# Patient Record
Sex: Female | Born: 1980 | Race: White | Hispanic: No | Marital: Married | Smoking: Never smoker
Health system: Southern US, Community
[De-identification: ages and names within clinical notes are randomized; demographics above are authoritative.]

## PROBLEM LIST (undated history)

## (undated) DIAGNOSIS — F32A Depression, unspecified: Secondary | ICD-10-CM

## (undated) DIAGNOSIS — F419 Anxiety disorder, unspecified: Secondary | ICD-10-CM

## (undated) DIAGNOSIS — L709 Acne, unspecified: Secondary | ICD-10-CM

## (undated) DIAGNOSIS — F329 Major depressive disorder, single episode, unspecified: Secondary | ICD-10-CM

## (undated) HISTORY — PX: WISDOM TOOTH EXTRACTION: SHX21

## (undated) HISTORY — PX: DILATION AND CURETTAGE OF UTERUS: SHX78

---

## 2013-02-12 ENCOUNTER — Other Ambulatory Visit (HOSPITAL_COMMUNITY): Payer: Self-pay | Admitting: Gynecology

## 2013-02-12 DIAGNOSIS — Z3141 Encounter for fertility testing: Secondary | ICD-10-CM

## 2013-02-20 ENCOUNTER — Ambulatory Visit (HOSPITAL_COMMUNITY)
Admission: RE | Admit: 2013-02-20 | Discharge: 2013-02-20 | Disposition: A | Discharge status: Home / Self Care | Payer: BC Managed Care – PPO | Source: Ambulatory Visit | Attending: Gynecology | Admitting: Gynecology

## 2013-02-20 DIAGNOSIS — Z3141 Encounter for fertility testing: Secondary | ICD-10-CM

## 2013-02-20 DIAGNOSIS — N979 Female infertility, unspecified: Secondary | ICD-10-CM | POA: Insufficient documentation

## 2013-02-20 MED ORDER — IOHEXOL 300 MG/ML  SOLN
10.0000 mL | Freq: Once | INTRAMUSCULAR | Status: AC | PRN
  Administered 2013-02-20: 10 mL

## 2013-08-12 ENCOUNTER — Other Ambulatory Visit (HOSPITAL_COMMUNITY): Payer: Self-pay | Admitting: Obstetrics & Gynecology

## 2013-08-12 DIAGNOSIS — Z0489 Encounter for examination and observation for other specified reasons: Secondary | ICD-10-CM

## 2013-08-12 DIAGNOSIS — IMO0002 Reserved for concepts with insufficient information to code with codable children: Secondary | ICD-10-CM

## 2013-08-19 ENCOUNTER — Ambulatory Visit (HOSPITAL_COMMUNITY): Payer: BC Managed Care – PPO

## 2013-08-20 DIAGNOSIS — Z3400 Encounter for supervision of normal first pregnancy, unspecified trimester: Secondary | ICD-10-CM | POA: Insufficient documentation

## 2013-08-20 DIAGNOSIS — Z3A25 25 weeks gestation of pregnancy: Secondary | ICD-10-CM

## 2013-08-27 ENCOUNTER — Ambulatory Visit (HOSPITAL_COMMUNITY): Payer: BC Managed Care – PPO

## 2013-11-15 ENCOUNTER — Inpatient Hospital Stay (HOSPITAL_COMMUNITY)
Admission: AD | Admit: 2013-11-15 | Payer: BC Managed Care – PPO | Source: Ambulatory Visit | Admitting: Obstetrics and Gynecology

## 2013-12-18 DIAGNOSIS — G4700 Insomnia, unspecified: Secondary | ICD-10-CM | POA: Insufficient documentation

## 2013-12-18 DIAGNOSIS — F32A Depression, unspecified: Secondary | ICD-10-CM | POA: Insufficient documentation

## 2013-12-18 DIAGNOSIS — Z0000 Encounter for general adult medical examination without abnormal findings: Secondary | ICD-10-CM | POA: Insufficient documentation

## 2014-10-16 DIAGNOSIS — Z021 Encounter for pre-employment examination: Secondary | ICD-10-CM | POA: Insufficient documentation

## 2014-12-10 IMAGING — RF DG HYSTEROGRAM
4 series · 4 of 4 positions shown · non-contrast
Comparison: none

CLINICAL DATA: Infertility

EXAM:
HYSTEROSALPINGOGRAM
TECHNIQUE: Hysterosalpingogram was performed by the ordering physician under
fluoroscopy. Fluoroscopic images were submitted for radiologic
interpretation following the procedure. Please see the procedural
report for the amount of contrast and the fluoroscopy time utilized.

[Series 1: run · 1 of 1 slices shown (1 of 4)]
[im 1/1]
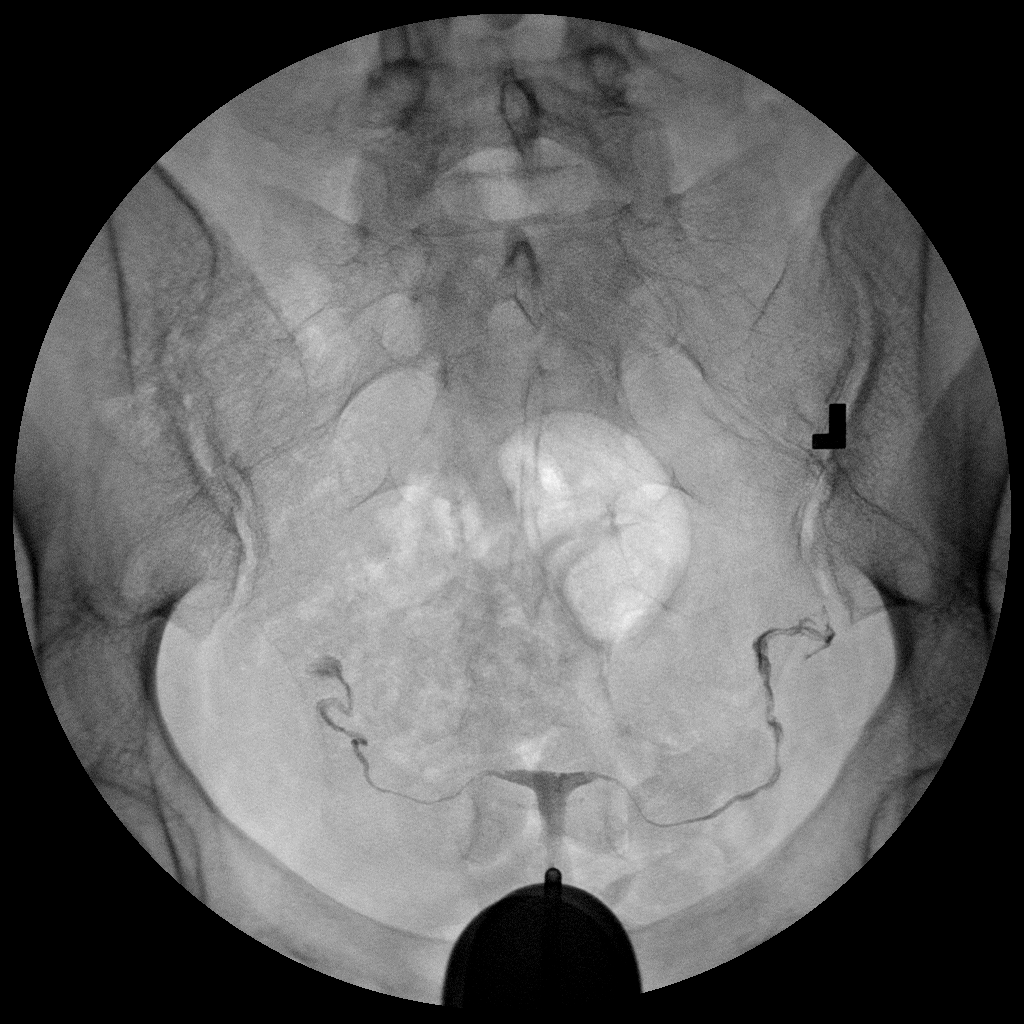

[Series 2: run · 1 of 1 slices shown (2 of 4)]
[im 1/1]
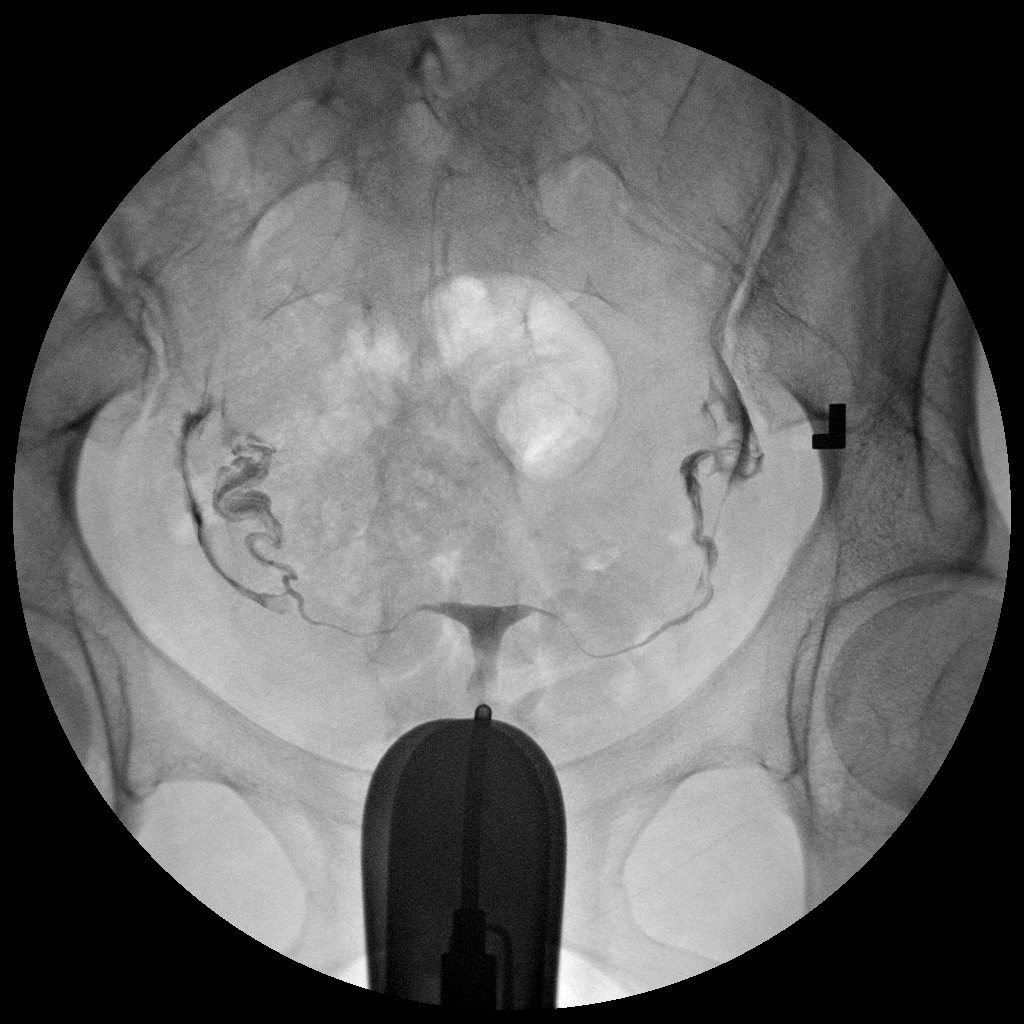

[Series 3: run · 1 of 1 slices shown (3 of 4)]
[im 1/1]
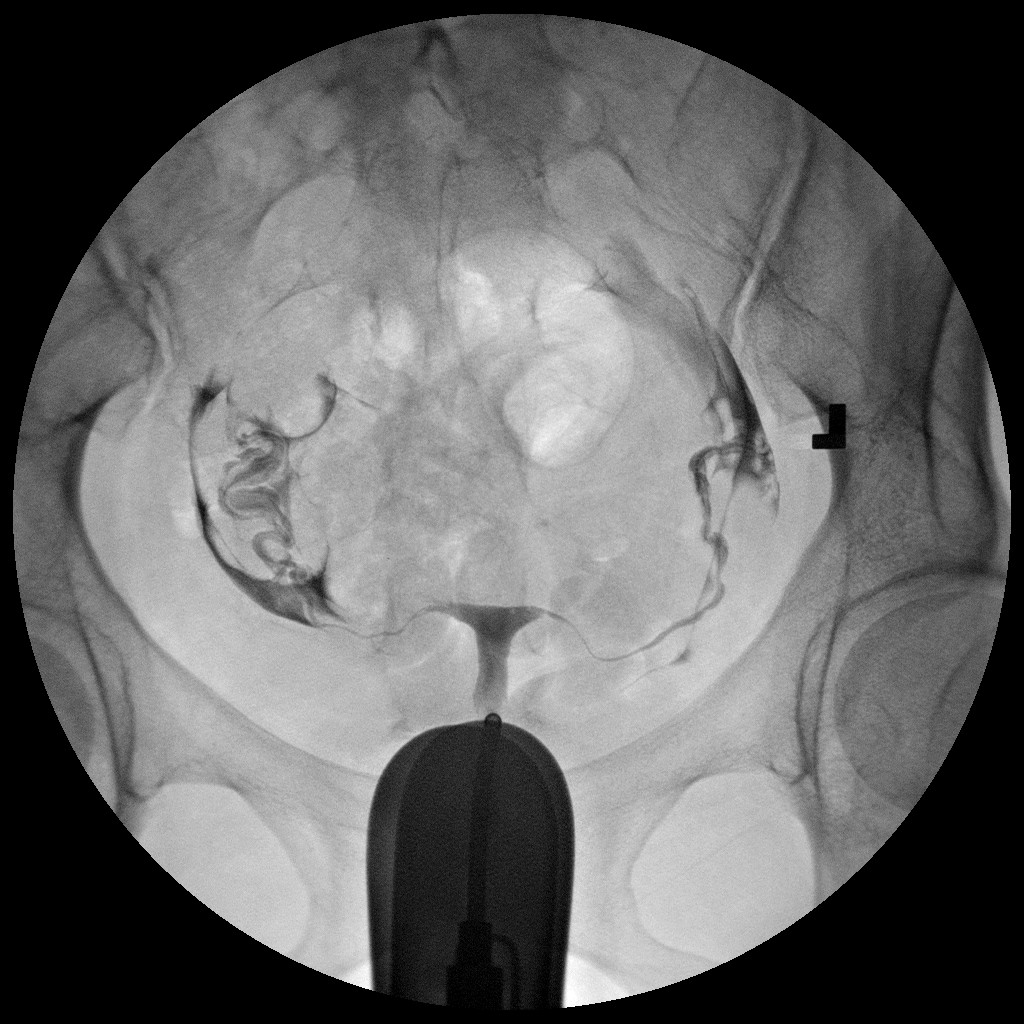

[Series 4: run · 1 of 1 slices shown (4 of 4)]
[im 1/1]
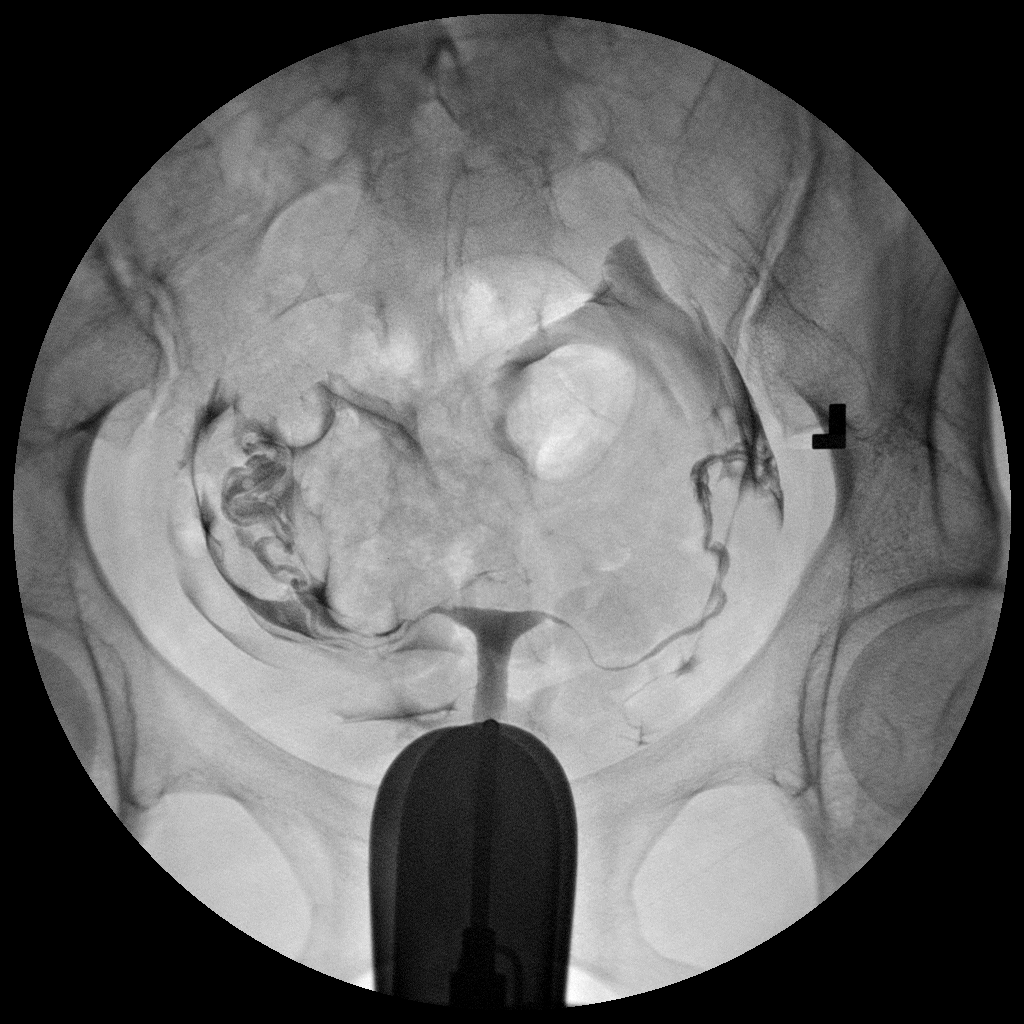

[4 of 4 positions shown; findings below may reference images not displayed]

FINDINGS: There is free spillage of contrast material from both fallopian
tubes. Normal appearance of the uterus. Several small filling
defects are noted within the uterine cavity which likely represent
air bubbles.
IMPRESSION: 1. Bilateral fallopian tube patency.

## 2014-12-22 ENCOUNTER — Ambulatory Visit (HOSPITAL_COMMUNITY): Payer: BC Managed Care – PPO

## 2014-12-25 ENCOUNTER — Ambulatory Visit (HOSPITAL_COMMUNITY)
Admission: RE | Admit: 2014-12-25 | Discharge: 2014-12-25 | Disposition: A | Discharge status: Home / Self Care | Payer: BC Managed Care – PPO | Source: Ambulatory Visit | Attending: Obstetrics & Gynecology | Admitting: Obstetrics & Gynecology

## 2014-12-25 DIAGNOSIS — Z3169 Encounter for other general counseling and advice on procreation: Secondary | ICD-10-CM | POA: Insufficient documentation

## 2014-12-25 NOTE — ED Notes (Signed)
Patient here for preconception consult.  VS, BP 126/86, 112, Wt. 167.8 lbs

## 2014-12-25 NOTE — Progress Notes (Signed)
MATERNAL FETAL MEDICINE CONSULT  Patient Name: Caitlyn [name] Medical Record Number:  [id number] Date of Birth: 08/10/1980 Requesting Physician Name:  Mitchel HonourMegan Morris, DO Date of Service: 12/25/2014  Chief Complaint Prior preterm delivery via classical cesarean section at 25 weeks  History of Present Illness Caitlyn [name] is a 34 y.o. G1P010 who was seen today for a preconceptual consult secondary to a prior preterm delivery via classical cesarean section at 25 weeks at the request of Mitchel HonourMegan Morris, DO.  She experienced progressively worsening back and pelvic pain in her last pregnancy and was found to be 4 cm upon arrival to the hospital.  Soon afterward underwent a classical cesarean section due to non-reassuring fetal status.  She returns today to discuss what can be done to decrease the risk of recurrent preterm birth in her subsequent pregnancies.  She has no complaints at this time.  Review of Systems Pertinent items are noted in HPI.  Obstetrical History G1 preterm labor with classical cesarean delivery at 25 weeks  Past Medical and Surgical History The patient has no history of chronic medical diseases or prior surgeries.  Family History The patient and the father of the baby have no family history of mental retardation, birth defects, or genetic diseases.  Social History The patient does not use tobacco, alcohol, or other illicit drugs.  Assessment and Recommendations 1.  Prior preterm birth.  I discussed the currently available therapies for the prevention of recurrent preterm birth in detail with Caitlyn [name] and her husband.  She should receive weekly 17-OH progesterone injections beginning at 16 weeks and continuing through 36 weeks.  In addition, she should have serial transvaginal cervical length measurements also starting at 16 weeks.  These can initially be done every 2 weeks, but should be done weekly if evidence of cervical shortening is found.  If her cervical length should  fall below 2.5 cm prior to 23 weeks of gestation cervical cerclage placement should be considered.  Should cervical shortening be found after viability, hospitalization could be considered especially if very remote from term and the patient lives far from a tertiary care center to ensure delivery of a severely preterm infant occurs at an appropriately equipped facility.  However, strict bedrest has not been proven to reduce risk of preterm delivery or prolong gestations and thus should be avoided.  However, activity restriction would be reasonable again especially if cervical shortening is seen very remote from term. 2.  Prior classical cesarean section.  Due to her prior classical uterine incision she will require delivery via repeat cesarean section no later than 36 week in all future pregnancies.  A future trial of labor is contraindicated as it would carry an unacceptably high risk of uterine rupture.  I spent 30 minutes with Caitlyn [name] today of which 50% was face-to-face counseling.  [name].  Please do not hesitate to contact us with questions.   Rema FendtNITSCHE,Areen Trautner, MD

## 2014-12-28 ENCOUNTER — Other Ambulatory Visit (HOSPITAL_COMMUNITY): Payer: Self-pay | Admitting: Obstetrics & Gynecology

## 2015-01-11 DIAGNOSIS — M546 Pain in thoracic spine: Secondary | ICD-10-CM | POA: Insufficient documentation

## 2016-02-28 DIAGNOSIS — Z23 Encounter for immunization: Secondary | ICD-10-CM | POA: Insufficient documentation

## 2016-06-05 DIAGNOSIS — R5383 Other fatigue: Secondary | ICD-10-CM | POA: Insufficient documentation

## 2016-06-05 DIAGNOSIS — R0683 Snoring: Secondary | ICD-10-CM | POA: Insufficient documentation

## 2017-02-09 ENCOUNTER — Inpatient Hospital Stay (HOSPITAL_COMMUNITY)
Admission: AD | Admit: 2017-02-09 | Discharge: 2017-02-11 | DRG: 786 | Disposition: A | Discharge status: Home / Self Care | Payer: BC Managed Care – PPO | Source: Ambulatory Visit | Attending: Obstetrics and Gynecology | Admitting: Obstetrics and Gynecology

## 2017-02-09 ENCOUNTER — Encounter (HOSPITAL_COMMUNITY): Payer: Self-pay | Admitting: *Deleted

## 2017-02-09 ENCOUNTER — Inpatient Hospital Stay (HOSPITAL_COMMUNITY): Payer: BC Managed Care – PPO

## 2017-02-09 ENCOUNTER — Inpatient Hospital Stay (HOSPITAL_COMMUNITY): Payer: BC Managed Care – PPO | Admitting: Anesthesiology

## 2017-02-09 ENCOUNTER — Other Ambulatory Visit: Payer: Self-pay | Admitting: Obstetrics and Gynecology

## 2017-02-09 ENCOUNTER — Encounter (HOSPITAL_COMMUNITY)
Admission: AD | Disposition: A | Discharge status: Home / Self Care | Payer: Self-pay | Source: Ambulatory Visit | Attending: Obstetrics and Gynecology

## 2017-02-09 DIAGNOSIS — O365930 Maternal care for other known or suspected poor fetal growth, third trimester, not applicable or unspecified: Secondary | ICD-10-CM | POA: Diagnosis present

## 2017-02-09 DIAGNOSIS — Z98891 History of uterine scar from previous surgery: Secondary | ICD-10-CM

## 2017-02-09 DIAGNOSIS — E669 Obesity, unspecified: Secondary | ICD-10-CM | POA: Diagnosis present

## 2017-02-09 DIAGNOSIS — O4593 Premature separation of placenta, unspecified, third trimester: Secondary | ICD-10-CM | POA: Diagnosis present

## 2017-02-09 DIAGNOSIS — O34212 Maternal care for vertical scar from previous cesarean delivery: Secondary | ICD-10-CM | POA: Diagnosis present

## 2017-02-09 DIAGNOSIS — O99214 Obesity complicating childbirth: Secondary | ICD-10-CM | POA: Diagnosis present

## 2017-02-09 DIAGNOSIS — O368130 Decreased fetal movements, third trimester, not applicable or unspecified: Principal | ICD-10-CM | POA: Diagnosis present

## 2017-02-09 DIAGNOSIS — O368330 Maternal care for abnormalities of the fetal heart rate or rhythm, third trimester, not applicable or unspecified: Secondary | ICD-10-CM | POA: Diagnosis not present

## 2017-02-09 DIAGNOSIS — Z3A32 32 weeks gestation of pregnancy: Secondary | ICD-10-CM | POA: Diagnosis not present

## 2017-02-09 DIAGNOSIS — O321XX0 Maternal care for breech presentation, not applicable or unspecified: Secondary | ICD-10-CM | POA: Diagnosis present

## 2017-02-09 DIAGNOSIS — O368390 Maternal care for abnormalities of the fetal heart rate or rhythm, unspecified trimester, not applicable or unspecified: Secondary | ICD-10-CM

## 2017-02-09 DIAGNOSIS — O368120 Decreased fetal movements, second trimester, not applicable or unspecified: Secondary | ICD-10-CM

## 2017-02-09 HISTORY — DX: Depression, unspecified: F32.A

## 2017-02-09 HISTORY — DX: Anxiety disorder, unspecified: F41.9

## 2017-02-09 HISTORY — DX: Major depressive disorder, single episode, unspecified: F32.9

## 2017-02-09 LAB — CBC
HEMATOCRIT: 37.1 % (ref 36.0–46.0)
HEMOGLOBIN: 12.4 g/dL (ref 12.0–15.0)
MCH: 31.5 pg (ref 26.0–34.0)
MCHC: 33.4 g/dL (ref 30.0–36.0)
MCV: 94.2 fL (ref 78.0–100.0)
Platelets: 220 10*3/uL (ref 150–400)
RBC: 3.94 MIL/uL (ref 3.87–5.11)
RDW: 12.7 % (ref 11.5–15.5)
WBC: 12.2 10*3/uL — AB (ref 4.0–10.5)

## 2017-02-09 LAB — TYPE AND SCREEN
ABO/RH(D): B POS
ANTIBODY SCREEN: NEGATIVE

## 2017-02-09 SURGERY — Surgical Case
Anesthesia: Spinal

## 2017-02-09 MED ORDER — CEFAZOLIN SODIUM-DEXTROSE 2-3 GM-%(50ML) IV SOLR
INTRAVENOUS | Status: AC
  Filled 2017-02-09: qty 50

## 2017-02-09 MED ORDER — SOD CITRATE-CITRIC ACID 500-334 MG/5ML PO SOLN
30.0000 mL | Freq: Once | ORAL | Status: AC
  Administered 2017-02-09: 30 mL via ORAL

## 2017-02-09 MED ORDER — SIMETHICONE 80 MG PO CHEW
80.0000 mg | CHEWABLE_TABLET | ORAL | Status: DC
  Administered 2017-02-10 (×2): 80 mg via ORAL
  Filled 2017-02-09 (×2): qty 1

## 2017-02-09 MED ORDER — ONDANSETRON HCL 4 MG/2ML IJ SOLN
INTRAMUSCULAR | Status: AC
  Filled 2017-02-09: qty 2

## 2017-02-09 MED ORDER — TERBUTALINE SULFATE 1 MG/ML IJ SOLN: 0.2500 mg | Freq: Once | INTRAMUSCULAR | Status: DC

## 2017-02-09 MED ORDER — HYDROMORPHONE HCL 1 MG/ML IJ SOLN
INTRAMUSCULAR | Status: DC | PRN
  Administered 2017-02-09: 1 mg via INTRAVENOUS

## 2017-02-09 MED ORDER — SUCCINYLCHOLINE CHLORIDE 20 MG/ML IJ SOLN
INTRAMUSCULAR | Status: DC | PRN
  Administered 2017-02-09: 160 mg via INTRAVENOUS

## 2017-02-09 MED ORDER — SODIUM CHLORIDE 0.9 % IR SOLN
Status: DC | PRN
  Administered 2017-02-09: 600 mL

## 2017-02-09 MED ORDER — MIDAZOLAM HCL 2 MG/2ML IJ SOLN
INTRAMUSCULAR | Status: DC | PRN
  Administered 2017-02-09: 2 mg via INTRAVENOUS

## 2017-02-09 MED ORDER — WITCH HAZEL-GLYCERIN EX PADS: 1.0000 "application " | MEDICATED_PAD | CUTANEOUS | Status: DC | PRN

## 2017-02-09 MED ORDER — BUPROPION HCL ER (XL) 150 MG PO TB24
150.0000 mg | ORAL_TABLET | Freq: Every day | ORAL | Status: DC
  Filled 2017-02-09: qty 1

## 2017-02-09 MED ORDER — SIMETHICONE 80 MG PO CHEW: 80.0000 mg | CHEWABLE_TABLET | ORAL | Status: DC | PRN

## 2017-02-09 MED ORDER — BETAMETHASONE SOD PHOS & ACET 6 (3-3) MG/ML IJ SUSP: 12.0000 mg | Freq: Once | INTRAMUSCULAR | Status: DC

## 2017-02-09 MED ORDER — HYDROMORPHONE HCL 1 MG/ML IJ SOLN
INTRAMUSCULAR | Status: AC
  Filled 2017-02-09: qty 1

## 2017-02-09 MED ORDER — LACTATED RINGERS IV SOLN
INTRAVENOUS | Status: DC
  Administered 2017-02-09: 20:00:00 via INTRAVENOUS

## 2017-02-09 MED ORDER — SCOPOLAMINE 1 MG/3DAYS TD PT72
MEDICATED_PATCH | TRANSDERMAL | Status: DC | PRN
  Administered 2017-02-09: 1 via TRANSDERMAL

## 2017-02-09 MED ORDER — BETAMETHASONE SOD PHOS & ACET 6 (3-3) MG/ML IJ SUSP
12.0000 mg | INTRAMUSCULAR | Status: DC
  Administered 2017-02-09: 12 mg via INTRAMUSCULAR
  Filled 2017-02-09: qty 2

## 2017-02-09 MED ORDER — FAMOTIDINE IN NACL 20-0.9 MG/50ML-% IV SOLN
INTRAVENOUS | Status: AC
  Filled 2017-02-09: qty 50

## 2017-02-09 MED ORDER — LACTATED RINGERS IV BOLUS (SEPSIS): 1000.0000 mL | Freq: Once | INTRAVENOUS | Status: DC

## 2017-02-09 MED ORDER — TERBUTALINE SULFATE 1 MG/ML IJ SOLN
INTRAMUSCULAR | Status: AC
  Filled 2017-02-09: qty 1

## 2017-02-09 MED ORDER — SENNOSIDES-DOCUSATE SODIUM 8.6-50 MG PO TABS
2.0000 | ORAL_TABLET | ORAL | Status: DC
  Administered 2017-02-10 (×2): 2 via ORAL
  Filled 2017-02-09 (×2): qty 2

## 2017-02-09 MED ORDER — ZOLPIDEM TARTRATE 5 MG PO TABS: 5.0000 mg | ORAL_TABLET | Freq: Every evening | ORAL | Status: DC | PRN

## 2017-02-09 MED ORDER — ACETAMINOPHEN 325 MG PO TABS: 650.0000 mg | ORAL_TABLET | ORAL | Status: DC | PRN

## 2017-02-09 MED ORDER — COCONUT OIL OIL
1.0000 "application " | TOPICAL_OIL | Status: DC | PRN
  Administered 2017-02-10: 1 via TOPICAL
  Filled 2017-02-09: qty 120

## 2017-02-09 MED ORDER — OXYCODONE-ACETAMINOPHEN 5-325 MG PO TABS: 2.0000 | ORAL_TABLET | ORAL | Status: DC | PRN

## 2017-02-09 MED ORDER — DIBUCAINE 1 % RE OINT: 1.0000 "application " | TOPICAL_OINTMENT | RECTAL | Status: DC | PRN

## 2017-02-09 MED ORDER — OXYTOCIN 40 UNITS IN LACTATED RINGERS INFUSION - SIMPLE MED: 2.5000 [IU]/h | INTRAVENOUS | Status: AC

## 2017-02-09 MED ORDER — FENTANYL CITRATE (PF) 250 MCG/5ML IJ SOLN
INTRAMUSCULAR | Status: AC
  Filled 2017-02-09: qty 5

## 2017-02-09 MED ORDER — TRAZODONE HCL 100 MG PO TABS
100.0000 mg | ORAL_TABLET | Freq: Every day | ORAL | Status: DC
  Administered 2017-02-10: 100 mg via ORAL
  Filled 2017-02-09 (×3): qty 1

## 2017-02-09 MED ORDER — SODIUM CHLORIDE 0.9 % IR SOLN
Status: DC | PRN
  Administered 2017-02-09: 1000 mL

## 2017-02-09 MED ORDER — BUSPIRONE HCL 10 MG PO TABS
10.0000 mg | ORAL_TABLET | Freq: Two times a day (BID) | ORAL | Status: DC
Start: 2017-02-09 — End: 2017-02-11
  Administered 2017-02-10 – 2017-02-11 (×4): 10 mg via ORAL
  Filled 2017-02-09 (×7): qty 1

## 2017-02-09 MED ORDER — TETANUS-DIPHTH-ACELL PERTUSSIS 5-2.5-18.5 LF-MCG/0.5 IM SUSP: 0.5000 mL | Freq: Once | INTRAMUSCULAR | Status: DC

## 2017-02-09 MED ORDER — LIDOCAINE HCL (CARDIAC) 20 MG/ML IV SOLN
INTRAVENOUS | Status: AC
  Filled 2017-02-09: qty 5

## 2017-02-09 MED ORDER — FENTANYL CITRATE (PF) 100 MCG/2ML IJ SOLN
25.0000 ug | INTRAMUSCULAR | Status: DC | PRN
  Administered 2017-02-09 (×2): 50 ug via INTRAVENOUS
  Administered 2017-02-09: 25 ug via INTRAVENOUS

## 2017-02-09 MED ORDER — SOD CITRATE-CITRIC ACID 500-334 MG/5ML PO SOLN
ORAL | Status: AC
  Filled 2017-02-09: qty 15

## 2017-02-09 MED ORDER — FENTANYL CITRATE (PF) 100 MCG/2ML IJ SOLN
INTRAMUSCULAR | Status: AC
  Filled 2017-02-09: qty 2

## 2017-02-09 MED ORDER — IBUPROFEN 600 MG PO TABS
600.0000 mg | ORAL_TABLET | Freq: Four times a day (QID) | ORAL | Status: DC
  Administered 2017-02-10 – 2017-02-11 (×6): 600 mg via ORAL
  Filled 2017-02-09 (×6): qty 1

## 2017-02-09 MED ORDER — FENTANYL CITRATE (PF) 100 MCG/2ML IJ SOLN
INTRAMUSCULAR | Status: AC
  Administered 2017-02-09: 25 ug via INTRAVENOUS
  Filled 2017-02-09: qty 2

## 2017-02-09 MED ORDER — OXYTOCIN 10 UNIT/ML IJ SOLN
INTRAVENOUS | Status: DC | PRN
  Administered 2017-02-09: 40 [IU] via INTRAVENOUS

## 2017-02-09 MED ORDER — FENTANYL CITRATE (PF) 100 MCG/2ML IJ SOLN
INTRAMUSCULAR | Status: AC
  Administered 2017-02-09: 50 ug via INTRAVENOUS
  Filled 2017-02-09: qty 2

## 2017-02-09 MED ORDER — PHENYLEPHRINE 8 MG IN D5W 100 ML (0.08MG/ML) PREMIX OPTIME
INJECTION | INTRAVENOUS | Status: AC
  Filled 2017-02-09: qty 100

## 2017-02-09 MED ORDER — MENTHOL 3 MG MT LOZG: 1.0000 | LOZENGE | OROMUCOSAL | Status: DC | PRN

## 2017-02-09 MED ORDER — DIPHENHYDRAMINE HCL 25 MG PO CAPS: 25.0000 mg | ORAL_CAPSULE | Freq: Four times a day (QID) | ORAL | Status: DC | PRN

## 2017-02-09 MED ORDER — SIMETHICONE 80 MG PO CHEW
80.0000 mg | CHEWABLE_TABLET | Freq: Three times a day (TID) | ORAL | Status: DC
  Administered 2017-02-10 – 2017-02-11 (×4): 80 mg via ORAL
  Filled 2017-02-09 (×4): qty 1

## 2017-02-09 MED ORDER — OXYTOCIN 10 UNIT/ML IJ SOLN
INTRAMUSCULAR | Status: AC
  Filled 2017-02-09: qty 4

## 2017-02-09 MED ORDER — FAMOTIDINE IN NACL 20-0.9 MG/50ML-% IV SOLN
20.0000 mg | Freq: Once | INTRAVENOUS | Status: AC
  Administered 2017-02-09: 20 mg via INTRAVENOUS

## 2017-02-09 MED ORDER — MIDAZOLAM HCL 2 MG/2ML IJ SOLN
INTRAMUSCULAR | Status: AC
  Filled 2017-02-09: qty 2

## 2017-02-09 MED ORDER — SUCCINYLCHOLINE CHLORIDE 200 MG/10ML IV SOSY
PREFILLED_SYRINGE | INTRAVENOUS | Status: AC
  Filled 2017-02-09: qty 20

## 2017-02-09 MED ORDER — OXYCODONE-ACETAMINOPHEN 5-325 MG PO TABS
1.0000 | ORAL_TABLET | ORAL | Status: DC | PRN
  Administered 2017-02-10: 1 via ORAL
  Filled 2017-02-09: qty 1

## 2017-02-09 MED ORDER — LIDOCAINE HCL (CARDIAC) 20 MG/ML IV SOLN
INTRAVENOUS | Status: DC | PRN
  Administered 2017-02-09: 100 mg via INTRAVENOUS

## 2017-02-09 MED ORDER — TERBUTALINE SULFATE 1 MG/ML IJ SOLN
0.2500 mg | Freq: Once | INTRAMUSCULAR | Status: AC
  Administered 2017-02-09: 0.25 mg via SUBCUTANEOUS

## 2017-02-09 MED ORDER — PROPOFOL 10 MG/ML IV BOLUS
INTRAVENOUS | Status: AC
Start: 2017-02-09 — End: 2017-02-09
  Filled 2017-02-09: qty 20

## 2017-02-09 MED ORDER — ONDANSETRON HCL 4 MG/2ML IJ SOLN
INTRAMUSCULAR | Status: DC | PRN
  Administered 2017-02-09: 4 mg via INTRAVENOUS

## 2017-02-09 MED ORDER — FENTANYL CITRATE (PF) 100 MCG/2ML IJ SOLN
INTRAMUSCULAR | Status: DC | PRN
  Administered 2017-02-09: 250 ug via INTRAVENOUS
  Administered 2017-02-09: 100 ug via INTRAVENOUS

## 2017-02-09 MED ORDER — CEFAZOLIN SODIUM-DEXTROSE 2-4 GM/100ML-% IV SOLN
2.0000 g | INTRAVENOUS | Status: AC
  Administered 2017-02-09: 2 g via INTRAVENOUS

## 2017-02-09 MED ORDER — PRENATAL MULTIVITAMIN CH
1.0000 | ORAL_TABLET | Freq: Every day | ORAL | Status: DC
  Administered 2017-02-10: 1 via ORAL
  Filled 2017-02-09: qty 1

## 2017-02-09 MED ORDER — DEXAMETHASONE SODIUM PHOSPHATE 10 MG/ML IJ SOLN
INTRAMUSCULAR | Status: DC | PRN
  Administered 2017-02-09: 10 mg via INTRAVENOUS

## 2017-02-09 MED ORDER — LACTATED RINGERS IV SOLN: INTRAVENOUS | Status: DC

## 2017-02-09 MED ORDER — PROPOFOL 10 MG/ML IV BOLUS
INTRAVENOUS | Status: DC | PRN
  Administered 2017-02-09: 200 mg via INTRAVENOUS

## 2017-02-09 MED ORDER — PROMETHAZINE HCL 25 MG/ML IJ SOLN: 6.2500 mg | INTRAMUSCULAR | Status: DC | PRN

## 2017-02-09 MED ORDER — MORPHINE SULFATE (PF) 0.5 MG/ML IJ SOLN
INTRAMUSCULAR | Status: AC
  Filled 2017-02-09: qty 10

## 2017-02-09 MED ORDER — LACTATED RINGERS IV SOLN
INTRAVENOUS | Status: DC | PRN
  Administered 2017-02-09: 20:00:00 via INTRAVENOUS

## 2017-02-09 SURGICAL SUPPLY — 37 items
BENZOIN TINCTURE PRP APPL 2/3 (GAUZE/BANDAGES/DRESSINGS) ×3 IMPLANT
CHLORAPREP W/TINT 26ML (MISCELLANEOUS) ×3 IMPLANT
CLAMP CORD UMBIL (MISCELLANEOUS) IMPLANT
CLOSURE WOUND 1/2 X4 (GAUZE/BANDAGES/DRESSINGS)
CLOTH BEACON ORANGE TIMEOUT ST (SAFETY) ×3 IMPLANT
DERMABOND ADVANCED (GAUZE/BANDAGES/DRESSINGS)
DERMABOND ADVANCED .7 DNX12 (GAUZE/BANDAGES/DRESSINGS) IMPLANT
DRSG OPSITE POSTOP 4X10 (GAUZE/BANDAGES/DRESSINGS) ×3 IMPLANT
ELECT REM PT RETURN 9FT ADLT (ELECTROSURGICAL) ×3
ELECTRODE REM PT RTRN 9FT ADLT (ELECTROSURGICAL) ×1 IMPLANT
EXTRACTOR VACUUM KIWI (MISCELLANEOUS) IMPLANT
GLOVE BIO SURGEON STRL SZ 6 (GLOVE) ×3 IMPLANT
GLOVE BIOGEL PI IND STRL 6 (GLOVE) ×2 IMPLANT
GLOVE BIOGEL PI IND STRL 7.0 (GLOVE) ×1 IMPLANT
GLOVE BIOGEL PI INDICATOR 6 (GLOVE) ×4
GLOVE BIOGEL PI INDICATOR 7.0 (GLOVE) ×2
GOWN STRL REUS W/TWL LRG LVL3 (GOWN DISPOSABLE) ×6 IMPLANT
KIT ABG SYR 3ML LUER SLIP (SYRINGE) ×3 IMPLANT
NEEDLE HYPO 25X5/8 SAFETYGLIDE (NEEDLE) ×3 IMPLANT
NS IRRIG 1000ML POUR BTL (IV SOLUTION) ×3 IMPLANT
PACK C SECTION WH (CUSTOM PROCEDURE TRAY) ×3 IMPLANT
PAD ABD DERMACEA PRESS 5X9 (GAUZE/BANDAGES/DRESSINGS) ×3 IMPLANT
PAD OB MATERNITY 4.3X12.25 (PERSONAL CARE ITEMS) ×3 IMPLANT
PENCIL SMOKE EVAC W/HOLSTER (ELECTROSURGICAL) ×3 IMPLANT
SPONGE GAUZE 4X4 12PLY STER LF (GAUZE/BANDAGES/DRESSINGS) ×3 IMPLANT
STRIP CLOSURE SKIN 1/2X4 (GAUZE/BANDAGES/DRESSINGS) IMPLANT
STRIP CLOSURE SKIN 1X5 (GAUZE/BANDAGES/DRESSINGS) ×2 IMPLANT
STRIP CLOSURE STERI-STRIP 1X5 (GAUZE/BANDAGES/DRESSINGS) ×1
SUT CHROMIC 0 CTX 36 (SUTURE) ×9 IMPLANT
SUT MON AB 2-0 CT1 27 (SUTURE) ×3 IMPLANT
SUT PDS AB 0 CT1 27 (SUTURE) IMPLANT
SUT PLAIN 0 NONE (SUTURE) IMPLANT
SUT VIC AB 0 CT1 36 (SUTURE) IMPLANT
SUT VIC AB 4-0 KS 27 (SUTURE) IMPLANT
TAPE CLOTH SURG 4X10 WHT LF (GAUZE/BANDAGES/DRESSINGS) ×3 IMPLANT
TOWEL OR 17X24 6PK STRL BLUE (TOWEL DISPOSABLE) ×3 IMPLANT
TRAY FOLEY BAG SILVER LF 14FR (SET/KITS/TRAYS/PACK) IMPLANT

## 2017-02-09 NOTE — Transfer of Care (Signed)
Immediate Anesthesia Transfer of Care Note  Patient: Caitlyn [name]  Procedure(s) Performed: CESAREAN SECTION (N/A )  Patient Location: PACU  Anesthesia Type:General  Level of Consciousness: awake, alert  and oriented  Airway & Oxygen Therapy: Patient Spontanous Breathing and Patient connected to nasal cannula oxygen  Post-op Assessment: Report given to RN and Post -op Vital signs reviewed and stable  Post vital signs: Reviewed and stable BP 149/76, HR 100, RR 16, SaO2 100%  Last Vitals:  Vitals:   02/09/17 1948 02/09/17 2058  BP: 122/69 (!) 149/76  Pulse:  98  Resp:  (!) 27  Temp:    SpO2:  100%    Last Pain:  Vitals:   02/09/17 1945  TempSrc: Oral  PainSc:          Complications: No apparent anesthesia complications

## 2017-02-09 NOTE — Brief Op Note (Signed)
02/09/2017  8:45 PM  PATIENT:  Caitlyn [name]  36 y.o. female  PRE-OPERATIVE DIAGNOSIS:   IUP at 8532 w 5 days Previous Classical C Section Category 3 Tracing  POST-OPERATIVE DIAGNOSIS:   Above Breech Tight Nuchal Cord Placental Abruption  PROCEDURE:  Procedure(s) with comments: CESAREAN SECTION (N/A) - Repeat edc 03/31/17 NKDA need RNFA  SURGEON:  Surgeon(s) and Role:    Marcelle OverlieMichelle Nihar Klus, MD  PHYSICIAN ASSISTANT:   ASSISTANTS: none   ANESTHESIA:   general  EBL:  300 mL   BLOOD ADMINISTERED:none  DRAINS: Urinary Catheter (Foley)   LOCAL MEDICATIONS USED:  NONE  SPECIMEN:  Source of Specimen:  PLACENTA  DISPOSITION OF SPECIMEN:  PATHOLOGY  COUNTS:  YES  TOURNIQUET:  * No tourniquets in log *  DICTATION: .Other Dictation: Dictation Number [id number]  PLAN OF CARE: Admit to inpatient   PATIENT DISPOSITION:  PACU - hemodynamically stable.   Delay start of Pharmacological VTE agent (>24hrs) due to surgical blood loss or risk of bleeding: not applicable

## 2017-02-09 NOTE — Anesthesia Procedure Notes (Signed)
Procedure Name: Intubation Date/Time: 02/09/2017 8:19 PM Performed by: Elbert Ewingshymer, Luara Faye S, CRNA Pre-anesthesia Checklist: Patient identified, Emergency Drugs available, Suction available, Patient being monitored and Timeout performed Patient Re-evaluated:Patient Re-evaluated prior to induction Oxygen Delivery Method: Circle system utilized Preoxygenation: Pre-oxygenation with 100% oxygen Induction Type: IV induction and Rapid sequence Laryngoscope Size: Glidescope and 3 Grade View: Grade I Tube type: Oral Tube size: 7.0 mm Number of attempts: 1 Airway Equipment and Method: Video-laryngoscopy Placement Confirmation: ETT inserted through vocal cords under direct vision,  positive ETCO2,  CO2 detector and breath sounds checked- equal and bilateral Secured at: 22 cm Tube secured with: Tape Dental Injury: Teeth and Oropharynx as per pre-operative assessment

## 2017-02-09 NOTE — Anesthesia Postprocedure Evaluation (Signed)
Anesthesia Post Note  Patient: Caitlyn [name]  Procedure(s) Performed: CESAREAN SECTION (N/A )     Patient location during evaluation: PACU Anesthesia Type: General Level of consciousness: awake and alert Pain management: pain level controlled Vital Signs Assessment: post-procedure vital signs reviewed and stable Respiratory status: spontaneous breathing, nonlabored ventilation and respiratory function stable Cardiovascular status: blood pressure returned to baseline and stable Postop Assessment: no apparent nausea or vomiting Anesthetic complications: no    Last Vitals:  Vitals:   02/09/17 2132 02/09/17 2145  BP: (!) 142/73 (!) 147/89  Pulse: 78 81  Resp: 14 14  Temp: (!) 36.4 C   SpO2: 99% 100%    Last Pain:  Vitals:   02/09/17 2145  TempSrc:   PainSc: 4    Pain Goal:    LLE Motor Response: Purposeful movement (02/09/17 2145)   RLE Motor Response: Purposeful movement (02/09/17 2145)   L Sensory Level: S1-Sole of foot, small toes (02/09/17 2145) R Sensory Level: S1-Sole of foot, small toes (02/09/17 2145)  Beryle Lathehomas E Barbara Keng

## 2017-02-09 NOTE — MAU Note (Signed)
Pt reports no fetal movement since last night.  She called the [facility] at 1800 and was told to come in to MAU. DEnies any vag bleeding or leaking, says she just feels some abdominal tightening but no pain.

## 2017-02-09 NOTE — Anesthesia Preprocedure Evaluation (Addendum)
Anesthesia Evaluation   Patient awake    Reviewed: Allergy & Precautions, NPO status , Patient's Chart, lab work & pertinent test resultsPreop documentation limited or incomplete due to emergent nature of procedure.  Airway Mallampati: II  TM Distance: >3 FB Neck ROM: Full    Dental  (+) Dental Advisory Given, Teeth Intact   Pulmonary    breath sounds clear to auscultation       Cardiovascular  Rhythm:Regular Rate:Tachycardia     Neuro/Psych Anxiety Depression    GI/Hepatic   Endo/Other  Obesity  Renal/GU   negative genitourinary   Musculoskeletal   Abdominal   Peds  Hematology   Anesthesia Other Findings   Reproductive/Obstetrics (+) Pregnancy                            Anesthesia Physical Anesthesia Plan  ASA: II and emergent  Anesthesia Plan: General   Post-op Pain Management:    Induction: Rapid sequence, Cricoid pressure planned and Intravenous  PONV Risk Score and Plan: 4 or greater and Treatment may vary due to age or medical condition, Ondansetron, Dexamethasone, Metaclopromide and Scopolamine patch - Pre-op  Airway Management Planned: Oral ETT and Video Laryngoscope Planned  Additional Equipment: None  Intra-op Plan:   Post-operative Plan: Extubation in OR  Informed Consent: I have reviewed the patients History and Physical, chart, labs and discussed the procedure including the risks, benefits and alternatives for the proposed anesthesia with the patient or authorized representative who has indicated his/her understanding and acceptance.   Dental advisory given  Plan Discussed with: CRNA and Surgeon  Anesthesia Plan Comments:       Anesthesia Quick Evaluation

## 2017-02-09 NOTE — MAU Provider Note (Signed)
Patient arrived to MAU with complaints of No fetal movement X 8 hours. Tried drinking fluids at home and still no movement. She has no pain and no bleeding. She is here tonight with her husband.   Reviewed fetal tracing prior to going into the room. Stat bedside BPP ordered. Notified Dr. Vincente PoliGrewal @ 1955. Medical screen done.   GENERAL: Well-developed, well-nourished female in no acute distress.  LUNGS: Effort normal ABDOMEN: soft, non tender  SKIN: Warm, dry and without erythema PSYCH: Normal mood and affect Cervix: closed, posterior  Blood pressure 122/69, pulse 82, temperature 98.2 F (36.8 C), temperature source Oral, height 5\' 5"  (1.651 m), weight 196 lb (88.9 kg), last menstrual period 06/25/2016.  Fetal Tracing: Baseline: indeterminate  Variability: minimal  Accelerations: none Decelerations: Frequent appearing date variable decels  Toco: Occasional    A:  Category 3 fetal tracing   P:   Urgent cesarean section called by Dr. Vincente PoliGrewal  Betamethasone given.   Duane Lopeasch, Lisvet Rasheed I, NP 02/09/2017 8:28 PM

## 2017-02-09 NOTE — MAU Note (Signed)
Dr Mal AmabileBrock notified of pt's admission and Dr Vincente PoliGrewal request to have pt in OR.

## 2017-02-09 NOTE — H&P (Signed)
36 year old G 4 P 0121 at 3132 w 5 days presented to MAU with NO FETAL MOVEMENT FOR 8 hours.  Category 3 tracing in MAU  Patient taken to OR by myself for STAT Section

## 2017-02-10 ENCOUNTER — Encounter (HOSPITAL_COMMUNITY): Payer: Self-pay | Admitting: General Practice

## 2017-02-10 ENCOUNTER — Other Ambulatory Visit: Payer: Self-pay

## 2017-02-10 LAB — CBC
HEMATOCRIT: 35 % — AB (ref 36.0–46.0)
HEMOGLOBIN: 11.9 g/dL — AB (ref 12.0–15.0)
MCH: 32 pg (ref 26.0–34.0)
MCHC: 34 g/dL (ref 30.0–36.0)
MCV: 94.1 fL (ref 78.0–100.0)
Platelets: 193 10*3/uL (ref 150–400)
RBC: 3.72 MIL/uL — ABNORMAL LOW (ref 3.87–5.11)
RDW: 12.7 % (ref 11.5–15.5)
WBC: 15.4 10*3/uL — AB (ref 4.0–10.5)

## 2017-02-10 LAB — RPR: RPR: NONREACTIVE

## 2017-02-10 LAB — ABO/RH: ABO/RH(D): B POS

## 2017-02-10 MED ORDER — BUPROPION HCL ER (XL) 300 MG PO TB24
300.0000 mg | ORAL_TABLET | Freq: Every day | ORAL | Status: DC
  Administered 2017-02-10 – 2017-02-11 (×2): 300 mg via ORAL
  Filled 2017-02-10 (×3): qty 1

## 2017-02-10 NOTE — Lactation Note (Signed)
This note was copied from a baby's chart. Lactation Consultation Note  Patient Name: Boy Unk PintoSara Summons ZOXWR'UToday's Date: 02/10/2017 Reason for consult: Initial assessment;NICU baby;Preterm <34wks;Infant < 6lbs Infant is 17 hours old & in the NICU; Lactation to see mom for Initial assessment in her room. Baby was born at 110w5d and weighed 2 lbs 4.7 oz at birth. Mom had recently been in the NICU and was about to pump when LC entered. Mom reports she has been pumping q 2.5-3hrs and is seeing more milk sooner than she did with her first. Mom reports she pumped for her first child while he was in the NICU (~3.5 months) but stopped once home because of the difficulties/ time commitment. Mom reports she is getting drops each time she pumps now. Mom reports she has a Spectra pump at home from her first child but plans to call insurance to get another pump.  Provided & reviewed NICU booklet; encouraged mom to pump 8-12x in 24hrs (q 2-3hrs) for 15-20 mins followed by hand expression. Reviewed milk volume expectations, milk storage, pumping log, BF resources.  Mom reports no questions. Encouraged mom to ask for help as needed.  Maternal Data Does the patient have breastfeeding experience prior to this delivery?: Yes  Feeding    LATCH Score                   Interventions Interventions: DEBP;Breast massage;Hand express  Lactation Tools Discussed/Used     Consult Status Consult Status: Follow-up Date: 02/11/17 Follow-up type: In-patient    Oneal GroutLaura C Maleya Leever 02/10/2017, 1:31 PM

## 2017-02-10 NOTE — Anesthesia Postprocedure Evaluation (Signed)
Anesthesia Post Note  Patient: Caitlyn [name]  Procedure(s) Performed: CESAREAN SECTION (N/A )     Patient location during evaluation: [facility] Unit Anesthesia Type: General Level of consciousness: awake and alert Pain management: satisfactory to patient Vital Signs Assessment: post-procedure vital signs reviewed and stable Respiratory status: spontaneous breathing Cardiovascular status: stable Postop Assessment: no apparent nausea or vomiting and no headache Anesthetic complications: no Comments: Pain score 4.    Last Vitals:  Vitals:   02/10/17 0409 02/10/17 0800  BP: 124/84 122/71  Pulse: 74 74  Resp: 18 18  Temp: 36.6 C 36.8 C  SpO2: 100% 99%    Last Pain:  Vitals:   02/10/17 0800  TempSrc: Oral  PainSc:    Pain Goal:                 [facility] HospitalWRINKLE,[name]

## 2017-02-10 NOTE — Addendum Note (Signed)
Addendum  created 02/10/17 0817 by Angela AdamWrinkle, Jaimi Belle G, CRNA   Charge Capture section accepted, Sign clinical note

## 2017-02-10 NOTE — Progress Notes (Signed)
Patient doing well. Baby in NICU - is symmetrically IUGR but doing well.  BP 124/84 (BP Location: Right Arm)   Pulse 74   Temp 97.9 F (36.6 C) (Oral)   Resp 18   Ht 5\' 5"  (1.651 m)   Wt 88.9 kg (196 lb)   LMP 06/25/2016   SpO2 100%   Breastfeeding? Unknown   BMI 32.62 kg/m  Results for orders placed or performed during the hospital encounter of 02/09/17 (from the past 24 hour(s))  RPR     Status: None   Collection Time: 02/09/17  8:00 PM  Result Value Ref Range   RPR Ser Ql Non Reactive Non Reactive  CBC     Status: Abnormal   Collection Time: 02/09/17  8:00 PM  Result Value Ref Range   WBC 12.2 (H) 4.0 - 10.5 K/uL   RBC 3.94 3.87 - 5.11 MIL/uL   Hemoglobin 12.4 12.0 - 15.0 g/dL   HCT 40.937.1 81.136.0 - 91.446.0 %   MCV 94.2 78.0 - 100.0 fL   MCH 31.5 26.0 - 34.0 pg   MCHC 33.4 30.0 - 36.0 g/dL   RDW 78.212.7 95.611.5 - 21.315.5 %   Platelets 220 150 - 400 K/uL  Type and screen [facility]      Status: None   Collection Time: 02/09/17  8:00 PM  Result Value Ref Range   ABO/RH(D) B POS    Antibody Screen NEG    Sample Expiration 02/12/2017   ABO/Rh     Status: None   Collection Time: 02/09/17  8:00 PM  Result Value Ref Range   ABO/RH(D) B POS   CBC     Status: Abnormal   Collection Time: 02/10/17  5:10 AM  Result Value Ref Range   WBC 15.4 (H) 4.0 - 10.5 K/uL   RBC 3.72 (L) 3.87 - 5.11 MIL/uL   Hemoglobin 11.9 (L) 12.0 - 15.0 g/dL   HCT 08.635.0 (L) 57.836.0 - 46.946.0 %   MCV 94.1 78.0 - 100.0 fL   MCH 32.0 26.0 - 34.0 pg   MCHC 34.0 30.0 - 36.0 g/dL   RDW 62.912.7 52.811.5 - 41.315.5 %   Platelets 193 150 - 400 K/uL   Abdomen is soft and non tender Bandage clean and dry  POD # 1  C Section Doing well Routine care

## 2017-02-11 MED ORDER — IBUPROFEN 600 MG PO TABS: 600.0000 mg | ORAL_TABLET | Freq: Four times a day (QID) | ORAL | 0 refills | Status: DC

## 2017-02-11 MED ORDER — OXYCODONE-ACETAMINOPHEN 5-325 MG PO TABS: 2.0000 | ORAL_TABLET | ORAL | 0 refills | Status: DC | PRN

## 2017-02-11 NOTE — Discharge Summary (Signed)
Obstetric Discharge Summary Reason for Admission: cesarean section Prenatal Procedures: none Intrapartum Procedures: cesarean: low cervical, transverse Postpartum Procedures: none Complications-Operative and Postpartum: placental abruption Hemoglobin  Date Value Ref Range Status  02/10/2017 11.9 (L) 12.0 - 15.0 g/dL Final   HCT  Date Value Ref Range Status  02/10/2017 35.0 (L) 36.0 - 46.0 % Final    Physical Exam:  General: alert, cooperative and appears stated age 100Lochia: appropriate Uterine Fundus: firm Incision: healing well, no significant drainage, no dehiscence DVT Evaluation: No evidence of DVT seen on physical exam.  Discharge Diagnoses: placental abruption and tight nuchal cord and iugr  Discharge Information: Date: 02/11/2017 Activity: pelvic rest Diet: routine Medications: Ibuprofen Condition: improved Instructions: refer to practice specific booklet Discharge to: home   Newborn Data: Live born female  Birth Weight: 2 lb 4.7 oz (1040 g) APGAR: 5, 10  Newborn Delivery   Birth date/time:  02/09/2017 20:20:00 Delivery type:  C-Section, Low Transverse C-section categorization:  Repeat     Home with NICU.  Heath Tesler L 02/11/2017, 8:55 AM

## 2017-02-11 NOTE — Progress Notes (Signed)

## 2017-02-13 NOTE — Op Note (Signed)
NAMUnk Pinto:  [name], Caitlyn                 ACCOUNT NO.:  [id number]  MEDICAL RECORD NO.:  [id number]  LOCATION:                                 FACILITY:  PHYSICIAN:  Nicolas Sisler L. Deaundre Allston, M.D.DATE OF BIRTH:  05-25-1980  DATE OF PROCEDURE:  02/09/2017 DATE OF DISCHARGE:  02/11/2017                              OPERATIVE REPORT   PREOPERATIVE DIAGNOSES: 1. Intrauterine pregnancy at 32 weeks and 5 days. 2. Previous classical cesarean section, category 3 tracing.  POSTOPERATIVE DIAGNOSES: 1. Intrauterine pregnancy at 32 weeks and 5 days. 2. Previous classical cesarean section, category 3 tracing. 3. Breech presentation. 4. Tight nuchal cord and placental abruption.  PROCEDURE:  Repeat low transverse cesarean section.  SURGEON:  Kalem Rockwell L. Vincente PoliGrewal, M.D.  ANESTHESIA:  General.  ESTIMATED BLOOD LOSS:  300 mL.  COMPLICATIONS:  None.  DRAINS:  Foley catheter.  PATHOLOGY:  Placenta.  DESCRIPTION OF PROCEDURE:  The patient was taken to the operating room urgently from MAU.  She was intubated without difficulty.  She was prepped and draped.  A Foley catheter was inserted.  A low-transverse incision was made and was carried down quickly to the fascia.  Fascia was entered in the midline.  The peritoneum was entered in a stat fashion.  The bladder blade was inserted.  A low-transverse incision was made.  Amniotic fluid appeared to be yellow color.  The baby was in breech presentation.  We delivered the baby.  There was a very tight nuchal cord around the baby.  The baby was a female infant.  The baby was handed to the NICU team and subsequently taken to NICU.  He did cry on the abdomen.  There was no blood in the cord for cord pH and the placenta was manually removed.  It was inspected.  It was small preterm. It appeared that there was a central area of abruption.  The uterus was exteriorized and was cleared of all clots and debris.  It was closed in 1 layer using 0 chromic in a running  locked stitch.  The uterus was returned to the abdomen.  Irrigation was performed.  The peritoneum was closed using 0 Vicryl.  The fascia was closed using 0 Vicryl in a running stitch.  The skin was closed with a subcuticular using 4-0 Vicryl on a Keith needle.  All sponge, lap, and instrument counts were correct x2.  The patient went to recovery room in stable condition.     Trixie Maclaren L. Vincente PoliGrewal, M.D.   ______________________________ Stann MainlandMichelle L. Vincente PoliGrewal, M.D.    Florestine AversMLG/MEDQ  D:  02/09/2017  T:  02/10/2017  Job:  [id number]

## 2017-02-15 NOTE — Progress Notes (Signed)
CLINICAL SOCIAL WORK MATERNAL/CHILD NOTE  Patient Details  Name: Boy Zacari Eifler MRN: [id number] Date of Birth: 02/09/2015  Date:  02/15/2017  Clinical Social Worker Initiating Note:  Marcellis Frampton Boyd-Gilyard Date/Time: Initiated:  02/12/17/1027     Child's Name:  Remington Hagmann   Biological Parents:  Mother, Father   Need for Interpreter:  None   Reason for Referral:  Parental Support of Premature Babies < 32 weeks/or Critically Ill babies, Behavioral Health Concerns(MOB has a hx of anxiety/depression.)   Address:  [address] [place] [address]    Phone number:  [phone] (home)     Additional phone number:   Household Members/Support Persons (HM/SP):   Household Member/Support Person 1   HM/SP Name Relationship DOB or Age  HM/SP -1 Jordan Vowels FOB 7/18/??  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   Education:  Graduate degree   Homebound arranged:    Financial Resources:  Private Insurance   Other Resources:      Cultural/Religious Considerations Which May Impact Care:  None Reported  Strengths:  Ability to meet basic needs , Home prepared for child , Compliance with medical plan , Psychotropic Medications, Understanding of illness   Psychotropic Medications:  Trazodone, Wellbutrin, Buspar      Pediatrician:       Pediatrician List:   [place]    [place]    [place]    [place]    [place]    [place]      Pediatrician Fax Number:    Risk Factors/Current Problems:  None   Cognitive State:  Able to Concentrate , Alert , Insightful , Goal Oriented , Linear Thinking    Mood/Affect:  Tearful ,  Relaxed , Calm , Comfortable    CSW Assessment: CSW met with MOB in effort to complete an assessment for NICU admission and MOB's MH hx. CSW met with MOB and FOB in the NICU conference room. MOB gave CSW permission to complete the assessment while FOB was present. MOB and FOB were polite, easy to engage, receptive to meeting with CSW, and supportive of one another throughout the assessment.    CSW inquired about their thoughts and feelings relating to infant's NICU admission.  MOB stated "It feels like deja vu." MOB explained that MOB oldest son was born at 25 weeks and was inpatient for 107 days (MOB delivered in [place] while MOB and FOB were on vacation). MOB became tearful sharing her previous NICU experience.  CSW validated and normalized MOB's and FOB's thoughts and feelings. CSW asked about MOB's MH hx and MOB openly acknowledged a hx of anxiety and depression. MOB reported MOB is currently taking Wellbutrin, Buspar, and Trazadone.  MOB reports being compliant with medication regiment and meets with medication provider routinely (Kim Dancey in [place]). MOB also reported having an outpatient therapist, Terry Vaughn, and MOB's next scheduled appointment is 02/24/2017. CSW assessed for safety and MOB denied SI and HI. CSW provided   education regarding the baby blues period vs. perinatal mood disorders, discussed treatment and gave resources for mental health follow up if concerns arise.  CSW recommends self-evaluation during the postpartum time period using the New Mom Checklist from Postpartum Progress and encouraged MOB to contact a medical professional if symptoms are noted at any time.    CSW discussed SSI in which baby qualifies for due to low birth weight and gestational weeks. Family interested in applying and CSW explained process and steps.   CSW will continue to provided emotional supports and resources while infant remains in NICU.   CSW Plan/Description:  Psychosocial Support  and Ongoing Assessment of Needs, Perinatal Mood and Anxiety Disorder (PMADs) Education, Other Information/Referral to Community Resources, Supplemental Security Income (SSI) Information, Sudden Infant Death Syndrome (SIDS) Education   Robynne Roat Boyd-Gilyard, MSW, LCSW Clinical Social Work [phone]  Odeal Welden D BOYD-GILYARD, LCSW 02/15/2017, 10:45 AM  

## 2017-03-07 ENCOUNTER — Inpatient Hospital Stay (HOSPITAL_COMMUNITY): Admit: 2017-03-07 | Payer: BC Managed Care – PPO | Admitting: Obstetrics & Gynecology

## 2017-03-27 ENCOUNTER — Ambulatory Visit: Payer: Self-pay

## 2017-03-27 NOTE — Lactation Note (Signed)
This note was copied from a baby's chart. Lactation Consultation Note  Patient Name: Caitlyn [name] WUJWJ'XToday's Date: 03/27/2017 Reason for consult: Follow-up assessment;NICU baby;Other (Comment)(plugged duct)   Mom c/o plugged duct to outer aspect of left breast. Mom is using hot showers and massage to try and resolve. Mom reports she had plugged ducts with last child. Discussed she may benefit from [facility] Lecithin 1000 mg/day to help if plugs keep reoccurring. Mom is sleeping 5 hours at night between pumpings, enc more frequent pumping at night to see if it helps. Mom to call back if continues to have problems. Mom is aware to call OB for s/s mastitis.    Maternal Data    Feeding Feeding Type: Breast Milk Nipple Type: Dr. Irving BurtonBrowns Ultra Preemie Length of feed: 40 min(10 nippling/)  LATCH Score                   Interventions    Lactation Tools Discussed/Used     Consult Status Consult Status: PRN Follow-up type: Call as needed    Ed BlalockSharon S Stella Bortle 03/27/2017, 11:26 AM

## 2017-04-25 ENCOUNTER — Ambulatory Visit: Payer: Self-pay

## 2017-04-25 NOTE — Lactation Note (Signed)
This note was copied from a baby's chart. Lactation Consultation Note  Patient Name: Caitlyn [name] BJYNW'GToday's Date: 04/25/2017 Reason for consult: Follow-up assessment;NICU baby;Other (Comment);Mother's request(per mom having some challenges with small plugs on the left breast )  Baby is 2  Months old  LC visited mom in NICU room 201 and as LC entered the room, mom holding baby.  Per mom having challenges with some plugs on the left breast. Per mom has noted her supply has gone down some in the last 3 days since she notice these small plugs. She has experienced these plugs before on the other breast.  Per mom pumps 8 x's a day and stretches it 4 hours at night.  LC recommended to go back to very 3 hours and PRN, also if busy use her hand pump and pump down to comfort  As a preventive measure. Mom shared she had been taking the lecithin 1,200 mg 2 tabs 2 times day, LC recommended spreading the dose over there day. Using warm compresses 20 mins prior to  Pumping and using a stimulated ( similar to an electric tooth brush on the effected areas) after using the moist heat.  In the evening when dad can help, use the warm compresses, while lying down flat on back, have dad use coconut oil and massage back towards chest wall and effected areas, hand express, and then pump both breast for 15 -20 mins , and and the end of pumping lean forward and massage downward.  Avoid using the hand free bra to pump when she can.  LC [facility] and provided a Lactation brochure.    Maternal Data    Feeding Feeding Type: Breast Milk Length of feed: 30 min  LATCH Score                   Interventions Interventions: Breast feeding basics reviewed  Lactation Tools Discussed/Used     Consult Status Consult Status: PRN Follow-up type: In-patient(baby in NICU )    Matilde SprangMargaret Ann Quanesha Klimaszewski 04/25/2017, 2:05 PM

## 2021-05-15 ENCOUNTER — Other Ambulatory Visit: Payer: Self-pay

## 2021-05-15 ENCOUNTER — Ambulatory Visit
Admission: RE | Admit: 2021-05-15 | Discharge: 2021-05-15 | Disposition: A | Discharge status: Home / Self Care | Payer: BC Managed Care – PPO | Source: Ambulatory Visit

## 2021-05-15 VITALS — BP 121/83 | HR 90 | Temp 98.5°F | Resp 16

## 2021-05-15 DIAGNOSIS — J020 Streptococcal pharyngitis: Secondary | ICD-10-CM | POA: Diagnosis not present

## 2021-05-15 LAB — POCT RAPID STREP A (OFFICE): Rapid Strep A Screen: POSITIVE — AB

## 2021-05-15 MED ORDER — AMOXICILLIN 875 MG PO TABS: 875.0000 mg | ORAL_TABLET | Freq: Two times a day (BID) | ORAL | 0 refills | Status: DC

## 2021-05-15 NOTE — ED Provider Notes (Signed)
?[facility] ? ? ? ?CSN: [id number] ?Arrival date & time: 05/15/21  1145 ? ? ?  ? ?History   ?Chief Complaint ?Chief Complaint  ?Patient presents with  ? Sore Throat  ? Fever  ? ? ?HPI ?Caitlyn [name] is a 41 y.o. female.  ? ?HPI ?Patient presents today was for evaluation of sore throat. 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  She denies any nausea or vomiting.  She has had fever which has been reduced with Tylenol.  No history of recurrent strep. ?Past Medical History:  ?Diagnosis Date  ? Anxiety   ? Depression   ? ? ?Patient Active Problem List  ? Diagnosis Date Noted  ? S/P cesarean section 02/09/2017  ? ? ?Past Surgical History:  ?Procedure Laterality Date  ? CESAREAN SECTION    ? CESAREAN SECTION N/A 02/09/2017  ? Procedure: CESAREAN SECTION;  Surgeon: Mitchel Honour, DO;  [facility];  Service: Obstetrics;  Laterality: N/A;  Repeat ?edc 03/31/17 ?NKDA ?need RNFA  ? WISDOM TOOTH EXTRACTION    ? ? ?OB History   ? ? Gravida  ?4  ? Para  ?2  ? Term  ?   ? Preterm  ?2  ? AB  ?2  ? Living  ?2  ?  ? ? SAB  ?2  ? IAB  ?   ? Ectopic  ?   ? Multiple  ?0  ? Live Births  ?2  ?   ?  ?  ? ? ? ?Home Medications   ? ?Prior to Admission medications   ?Medication Sig Start Date End Date Taking? Authorizing Provider  ?buPROPion (WELLBUTRIN XL) 150 MG 24 hr tablet Take 150 mg by mouth daily.   Yes [provider]  ?hydrOXYzine (ATARAX) 10 MG tablet Take by mouth.   Yes [provider]  ?traZODone (DESYREL) 100 MG tablet Take 100 mg by mouth at bedtime.   Yes [provider]  ?busPIRone (BUSPAR) 10 MG tablet Take 10 mg by mouth 2 (two) times daily.    [provider]  ?ibuprofen (ADVIL,MOTRIN) 600 MG tablet Take 1 tablet (600 mg total) by mouth every 6 (six) hours. 02/11/17   Marcelle Overlie, MD  ?oxyCODONE-acetaminophen (PERCOCET/ROXICET) 5-325 MG tablet Take 2 tablets by mouth every 4 (four) hours as needed (pain scale > 7).  02/11/17   Marcelle Overlie, MD  ?prenatal vitamin w/FE, FA (PRENATAL 1 + 1) 27-1 MG TABS tablet Take 1 tablet by mouth daily at 12 noon.    [provider]  ? ? ?Family History ?Family History  ?Problem Relation Age of Onset  ? Diabetes Brother   ? Diabetes Maternal Grandfather   ? ? ?Social History ?Social History  ? ?Tobacco Use  ? Smoking status: Never  ? Smokeless tobacco: Never  ?Vaping Use  ? Vaping Use: Never used  ?Substance Use Topics  ? Alcohol use: No  ? Drug use: No  ? ? ? ?Allergies   ?Patient has no known allergies. ? ? ?Review of Systems ?Review of Systems ?Pertinent negatives listed in HPI  ? ?Physical Exam ?Triage Vital Signs ?ED Triage Vitals  ?Enc Vitals Group  ?   BP 05/15/21 1214 121/83  ?   Pulse Rate 05/15/21 1214 90  ?   Resp 05/15/21 1214 16  ?   Temp 05/15/21 1214 98.5 ?F (36.9 ?C)  ?   Temp Source 05/15/21 1214 Oral  ?  SpO2 05/15/21 1214 99 %  ?   Weight --   ?   Height --   ?   Head Circumference --   ?   Peak Flow --   ?   Pain Score 05/15/21 1222 2  ?   Pain Loc --   ?   Pain Edu? --   ?   Excl. in GC? --   ? ?No data found. ? ?Updated Vital Signs ?BP 121/83 (BP Location: Left Arm)   Pulse 90   Temp 98.5 ?F (36.9 ?C) (Oral)   Resp 16   LMP  (LMP Unknown)   SpO2 99%  ? ?Visual Acuity ?Right Eye Distance:   ?Left Eye Distance:   ?Bilateral Distance:   ? ?Right Eye Near:   ?Left Eye Near:    ?Bilateral Near:    ? ?Physical Exam ? ?General Appearance:    Alert, cooperative, no distress  ?HENT:   Normocephalic, ears normal, nares mucosal edema with congestion, rhinorrhea, oropharynx erythematous e  ?Eyes:    PERRL, conjunctiva/corneas clear, EOM's intact       ?Lungs:     Clear to auscultation bilaterally, respirations unlabored  ?Heart:    Regular rate and rhythm  ?Neurologic:   Awake, alert, oriented x 3. No apparent focal neurological           defect.   ?  ? ?UC Treatments / Results  ?Labs ?(all labs ordered are listed, but only abnormal results are displayed) ?Labs  Reviewed  ?POCT RAPID STREP A (OFFICE)  ? ? ?EKG ? ? ?Radiology ?No results found. ? ?Procedures ?Procedures (including critical care time) ? ?Medications Ordered in UC ?Medications - No data to display ? ?Initial Impression / Assessment and Plan / UC Course  ?I have reviewed the triage vital signs and the nursing notes. ? ?Pertinent labs & imaging results that were available during my care of the patient were reviewed by me and considered in my medical decision making (see chart for details). ? ?  ?Strep throat confirmed by positive rapid test.  Treatment today with amoxicillin 875 twice daily for 10 days.  ?Encourage salt water gargles to help with throat pain. ?Final Clinical Impressions(s) / UC Diagnoses  ? ?Final diagnoses:  ?Streptococcal sore throat  ? ?Discharge Instructions   ?None ?  ? ?ED Prescriptions   ? ? Medication Sig Dispense Auth. Provider  ? amoxicillin (AMOXIL) 875 MG tablet Take 1 tablet (875 mg total) by mouth 2 (two) times daily. 20 tablet Bing Neighbors, FNP  ? ?  ? ?PDMP not reviewed this encounter. ?  ?Bing Neighbors, FNP ?05/20/21 2025 ? ?

## 2021-05-15 NOTE — ED Triage Notes (Signed)
Pt c/o ST an x 3 days and fever x 2 days  ?

## 2021-12-22 DIAGNOSIS — Z85828 Personal history of other malignant neoplasm of skin: Secondary | ICD-10-CM | POA: Insufficient documentation

## 2023-01-22 ENCOUNTER — Other Ambulatory Visit: Payer: Self-pay | Admitting: Family Medicine

## 2023-01-22 DIAGNOSIS — Z1231 Encounter for screening mammogram for malignant neoplasm of breast: Secondary | ICD-10-CM

## 2023-02-22 ENCOUNTER — Ambulatory Visit
Admission: RE | Admit: 2023-02-22 | Discharge: 2023-02-22 | Disposition: A | Discharge status: Home / Self Care | Payer: BC Managed Care – PPO | Source: Ambulatory Visit | Attending: Family Medicine | Admitting: Family Medicine

## 2023-02-22 DIAGNOSIS — Z1231 Encounter for screening mammogram for malignant neoplasm of breast: Secondary | ICD-10-CM

## 2023-03-09 DIAGNOSIS — B029 Zoster without complications: Secondary | ICD-10-CM | POA: Insufficient documentation

## 2023-03-10 ENCOUNTER — Ambulatory Visit
Admission: EM | Admit: 2023-03-10 | Discharge: 2023-03-10 | Disposition: A | Discharge status: Home / Self Care | Payer: BC Managed Care – PPO

## 2023-03-10 DIAGNOSIS — B029 Zoster without complications: Secondary | ICD-10-CM | POA: Diagnosis not present

## 2023-03-10 MED ORDER — TRAMADOL HCL 50 MG PO TABS: 50.0000 mg | ORAL_TABLET | Freq: Four times a day (QID) | ORAL | 0 refills | Status: DC | PRN

## 2023-03-10 MED ORDER — VALACYCLOVIR HCL 1 G PO TABS: 1000.0000 mg | ORAL_TABLET | Freq: Three times a day (TID) | ORAL | 0 refills | Status: DC

## 2023-03-10 NOTE — ED Triage Notes (Signed)
Patient to Urgent Care with complaints of rash present to right sided glute w/ pain.   Symptoms started Thursday. Had been using benzoyl peroxide thinking the area was a pimple.

## 2023-03-10 NOTE — ED Provider Notes (Signed)
[facility]    CSN: [id number] Arrival date & time: 03/10/23  7846      History   Chief Complaint Chief Complaint  Patient presents with   Rash    HPI Caitlyn [name] is a 42 y.o. female.  Accompanied by her mother, patient presents with painful rash on her right buttock x 2 days.  It started as a single papule and is now clusters of papules and vesicles.  She has been treating it with hydrocortisone cream, oatmeal baths, diaper rash cream.  No fever or chills.  Patient is concerned for shingles.  She denies current pregnancy or breastfeeding.     The history is provided by the patient, a relative and medical records.    Past Medical History:  Diagnosis Date   Anxiety    Depression     Patient Active Problem List   Diagnosis Date Noted   S/P cesarean section 02/09/2017    Past Surgical History:  Procedure Laterality Date   CESAREAN SECTION     CESAREAN SECTION N/A 02/09/2017   Procedure: CESAREAN SECTION;  Surgeon: Mitchel Honour, DO;  [facility];  Service: Obstetrics;  Laterality: N/A;  Repeat edc 03/31/17 NKDA need RNFA   WISDOM TOOTH EXTRACTION      OB History     Gravida  4   Para  2   Term      Preterm  2   AB  2   Living  2      SAB  2   IAB      Ectopic      Multiple  0   Live Births  2            Home Medications    Prior to Admission medications   Medication Sig Start Date End Date Taking? Authorizing Provider  traMADol (ULTRAM) 50 MG tablet Take 1 tablet (50 mg total) by mouth every 6 (six) hours as needed. 03/10/23  Yes Mickie Bail, NP  valACYclovir (VALTREX) 1000 MG tablet Take 1 tablet (1,000 mg total) by mouth 3 (three) times daily. 03/10/23  Yes Mickie Bail, NP  Adapalene-Benzoyl Peroxide 0.1-2.5 % gel Apply topically daily.    [provider]  buPROPion (WELLBUTRIN XL) 150 MG 24 hr tablet Take 150 mg by mouth daily.    [provider]  hydrOXYzine (ATARAX) 10 MG tablet  Take by mouth.    [provider]  ibuprofen (ADVIL,MOTRIN) 600 MG tablet Take 1 tablet (600 mg total) by mouth every 6 (six) hours. 02/11/17   Marcelle Overlie, MD  prenatal vitamin w/FE, FA (PRENATAL 1 + 1) 27-1 MG TABS tablet Take 1 tablet by mouth daily at 12 noon.    [provider]  spironolactone (ALDACTONE) 50 MG tablet Take 50 mg by mouth 2 (two) times daily.    [provider]  traZODone (DESYREL) 100 MG tablet Take 100 mg by mouth at bedtime.    [provider]    Family History Family History  Problem Relation Age of Onset   Diabetes Brother    Diabetes Maternal Grandfather     Social History Social History   Tobacco Use   Smoking status: Never   Smokeless tobacco: Never  Vaping Use   Vaping status: Never Used  Substance Use Topics   Alcohol use: No   Drug use: No     Allergies   Patient has no known allergies.   Review of Systems Review  of Systems  Constitutional:  Negative for chills and fever.  Musculoskeletal:  Negative for gait problem and joint swelling.  Skin:  Positive for color change and rash.  Neurological:  Negative for weakness and numbness.     Physical Exam Triage Vital Signs ED Triage Vitals  Encounter Vitals Group     BP      Systolic BP Percentile      Diastolic BP Percentile      Pulse      Resp      Temp      Temp src      SpO2      Weight      Height      Head Circumference      Peak Flow      Pain Score      Pain Loc      Pain Education      Exclude from Growth Chart    No data found.  Updated Vital Signs BP 103/69   Pulse 91   Temp 98 F (36.7 C)   Resp 18   SpO2 98%   Visual Acuity Right Eye Distance:   Left Eye Distance:   Bilateral Distance:    Right Eye Near:   Left Eye Near:    Bilateral Near:     Physical Exam Constitutional:      General: She is not in acute distress. HENT:     Mouth/Throat:     Mouth: Mucous membranes are moist.  Cardiovascular:      Rate and Rhythm: Normal rate and regular rhythm.  Pulmonary:     Effort: Pulmonary effort is normal. No respiratory distress.  Skin:    General: Skin is warm and dry.     Findings: Rash present.     Comments: Clustered pink papular and vesicular rash on right buttock.  No drainage.  Neurological:     Mental Status: She is alert.      UC Treatments / Results  Labs (all labs ordered are listed, but only abnormal results are displayed) Labs Reviewed - No data to display  EKG   Radiology No results found.  Procedures Procedures (including critical care time)  Medications Ordered in UC Medications - No data to display  Initial Impression / Assessment and Plan / UC Course  I have reviewed the triage vital signs and the nursing notes.  Pertinent labs & imaging results that were available during my care of the patient were reviewed by me and considered in my medical decision making (see chart for details).    Herpes zoster.  Treating with Valtrex.  Instructed patient to take Tylenol or ibuprofen as needed for discomfort.  15 tablets of tramadol prescribed for pain that is not controlled with OTC medications; precautions for drowsiness with this medication discussed.  Education provided on shingles.  Instructed patient to follow-up with her PCP on Monday.  ED precautions given.  She agrees to plan of care.  Final Clinical Impressions(s) / UC Diagnoses   Final diagnoses:  Herpes zoster without complication     Discharge Instructions      Take the Valtrex as directed.    Take Tylenol or ibuprofen as needed for discomfort.    If needed, take the tramadol as directed.  Do not drive, operate machinery, drink alcohol, or perform dangerous activities while taking this medication as it may cause drowsiness.  Follow up with your primary care provider on Monday.  Go to the emergency department if  you have worsening symptoms.        ED Prescriptions     Medication Sig  Dispense Auth. Provider   valACYclovir (VALTREX) 1000 MG tablet Take 1 tablet (1,000 mg total) by mouth 3 (three) times daily. 21 tablet Mickie Bail, NP   traMADol (ULTRAM) 50 MG tablet Take 1 tablet (50 mg total) by mouth every 6 (six) hours as needed. 15 tablet Mickie Bail, NP      I have reviewed the PDMP during this encounter.   Mickie Bail, NP 03/10/23 1002

## 2023-03-10 NOTE — Discharge Instructions (Addendum)
Take the Valtrex as directed.    Take Tylenol or ibuprofen as needed for discomfort.    If needed, take the tramadol as directed.  Do not drive, operate machinery, drink alcohol, or perform dangerous activities while taking this medication as it may cause drowsiness.  Follow up with your primary care provider on Monday.  Go to the emergency department if you have worsening symptoms.

## 2023-04-18 ENCOUNTER — Ambulatory Visit: Payer: 59 | Admitting: Family Medicine

## 2023-04-18 ENCOUNTER — Encounter: Payer: Self-pay | Admitting: Family Medicine

## 2023-04-18 VITALS — BP 120/72 | HR 101 | Temp 97.8°F | Ht 65.0 in | Wt 153.1 lb

## 2023-04-18 DIAGNOSIS — E282 Polycystic ovarian syndrome: Secondary | ICD-10-CM | POA: Insufficient documentation

## 2023-04-18 DIAGNOSIS — E559 Vitamin D deficiency, unspecified: Secondary | ICD-10-CM | POA: Diagnosis not present

## 2023-04-18 DIAGNOSIS — N926 Irregular menstruation, unspecified: Secondary | ICD-10-CM | POA: Insufficient documentation

## 2023-04-18 DIAGNOSIS — R220 Localized swelling, mass and lump, head: Secondary | ICD-10-CM | POA: Diagnosis not present

## 2023-04-18 DIAGNOSIS — N289 Disorder of kidney and ureter, unspecified: Secondary | ICD-10-CM | POA: Insufficient documentation

## 2023-04-18 DIAGNOSIS — A749 Chlamydial infection, unspecified: Secondary | ICD-10-CM | POA: Insufficient documentation

## 2023-04-18 DIAGNOSIS — N911 Secondary amenorrhea: Secondary | ICD-10-CM | POA: Insufficient documentation

## 2023-04-18 DIAGNOSIS — B029 Zoster without complications: Secondary | ICD-10-CM | POA: Diagnosis not present

## 2023-04-18 DIAGNOSIS — N912 Amenorrhea, unspecified: Secondary | ICD-10-CM | POA: Insufficient documentation

## 2023-04-18 DIAGNOSIS — N970 Female infertility associated with anovulation: Secondary | ICD-10-CM | POA: Insufficient documentation

## 2023-04-18 NOTE — Progress Notes (Signed)
 Patient Office Visit  Assessment & Plan:  Vitamin D  deficiency -     VITAMIN D  25 Hydroxy (Vit-D Deficiency, Fractures)  Abnormal kidney function -     COMPLETE METABOLIC PANEL WITH GFR  Herpes zoster without complication  Mass of scalp -     Ambulatory referral to Plastic Surgery    Return in about 9 months (around 01/16/2024), or if symptoms worsen or fail to improve, for physical.  Plastic surgery consult ordered in case this mass/cyst needs to be removed.  Patient will check with the dermatology first to see if they would remove this.  Follow-up on lab work and notify patient.  Told her that shingles vaccine is indicated for individuals over 41 years of age.  Patient will need a physical end of October or early November this year.  Subjective:    [name], female    DOB: 12-16-1980  Age: 43 y.o. MRN: [id number]  Chief Complaint  Patient presents with   Medical Management of Chronic Issues   Establish Care    Recently had shingles Knot on forehead getting larger in size.    HPI Left forehead/scalp cyst/mass present for about 2 years. Pt's mom thinks it's getting bigger.  Patient not having any tenderness or discomfort.  Patient states she really does not notice it because it is at the hairline and usually has her hair over this area.  Patient has not noticed any redness or any drainage or opening from the site.  Patient is affiliated with [facility] dermatology but has not contacted them.  Patient did have last appointment in August but did not bring it up. Herpes-left buttock area -patient was seen by urgent care December 28.  Pt got Valtrex  and Tramadol  but Tramadol  but did not help with the pain.  Patient states they would not give her prescription for gabapentin and was told to call her primary care physician but I was changing practices at that time.  No longer having pain but wonders when she can get the Shingrix vaccine.  Patient was told by the urgent care  that was up to the PCP to decide this. Vitamin D  deficiency-patient was told that her vitamin D  was low last October.  Patient has been taking vitamin D  2000 IUs/day and needs a recheck. Abnormal kidney function-patient was told that her kidney function was abnormal and to stop NSAIDs.  Patient does not take NSAIDs.  Patient may have been a little dehydrated when she came in for physical in October. The 10-year ASCVD risk score (Arnett DK, et al., 2019) is: 0.3%  Past Medical History:  Diagnosis Date   Anxiety    Depression    Past Surgical History:  Procedure Laterality Date   CESAREAN SECTION     CESAREAN SECTION N/A 02/09/2017   Procedure: CESAREAN SECTION;  Surgeon: Dannielle Bouchard, DO;  [facility];  Service: Obstetrics;  Laterality: N/A;  Repeat edc 03/31/17 NKDA need RNFA   WISDOM TOOTH EXTRACTION     Social History   Tobacco Use   Smoking status: Never   Smokeless tobacco: Never  Vaping Use   Vaping status: Never Used  Substance Use Topics   Alcohol use: No   Drug use: No   Family History  Problem Relation Age of Onset   Diabetes Brother    Intellectual disability Brother    Diabetes Maternal Grandfather    No Known Allergies  ROS    Objective:    BP  120/72   Pulse (!) 101   Temp 97.8 F (36.6 C)   Ht 5' 5 (1.651 m)   Wt 153 lb 2 oz (69.5 kg)   LMP 12/12/2022 (Approximate)   SpO2 98%   Breastfeeding No   BMI 25.48 kg/m  BP Readings from Last 3 Encounters:  04/18/23 120/72  03/10/23 103/69  05/15/21 121/83   Wt Readings from Last 3 Encounters:  04/18/23 153 lb 2 oz (69.5 kg)  02/09/17 196 lb (88.9 kg)    Physical Exam Vitals and nursing note reviewed.  Constitutional:      Appearance: Normal appearance.  HENT:     Head: Normocephalic.     Right Ear: Tympanic membrane, ear canal and external ear normal.     Left Ear: Tympanic membrane, ear canal and external ear normal.  Eyes:     Extraocular Movements: Extraocular  movements intact.     Conjunctiva/sclera: Conjunctivae normal.     Pupils: Pupils are equal, round, and reactive to light.  Cardiovascular:     Rate and Rhythm: Normal rate and regular rhythm.     Heart sounds: Normal heart sounds.  Pulmonary:     Effort: Pulmonary effort is normal.     Breath sounds: Normal breath sounds.  Musculoskeletal:     Right lower leg: No edema.     Left lower leg: No edema.  Skin:    Findings: Lesion present. No rash.     Comments: Left scalp- 2.5 cm by 2.5 cm left forehead/scalp area, nontender  Neurological:     General: No focal deficit present.     Mental Status: She is alert and oriented to person, place, and time.  Psychiatric:        Mood and Affect: Mood normal.        Behavior: Behavior normal.        Thought Content: Thought content normal.        Judgment: Judgment normal.      No results found for any visits on 04/18/23.

## 2023-04-19 LAB — COMPLETE METABOLIC PANEL WITH GFR
AG Ratio: 1.7 (calc) (ref 1.0–2.5)
ALT: 13 U/L (ref 6–29)
AST: 15 U/L (ref 10–30)
Albumin: 4.5 g/dL (ref 3.6–5.1)
Alkaline phosphatase (APISO): 60 U/L (ref 31–125)
BUN/Creatinine Ratio: 13 (calc) (ref 6–22)
BUN: 15 mg/dL (ref 7–25)
CO2: 28 mmol/L (ref 20–32)
Calcium: 9.8 mg/dL (ref 8.6–10.2)
Chloride: 104 mmol/L (ref 98–110)
Creat: 1.2 mg/dL — ABNORMAL HIGH (ref 0.50–0.99)
Globulin: 2.7 g/dL (ref 1.9–3.7)
Glucose, Bld: 87 mg/dL (ref 65–99)
Potassium: 4.9 mmol/L (ref 3.5–5.3)
Sodium: 139 mmol/L (ref 135–146)
Total Bilirubin: 0.6 mg/dL (ref 0.2–1.2)
Total Protein: 7.2 g/dL (ref 6.1–8.1)
eGFR: 59 mL/min/{1.73_m2} — ABNORMAL LOW (ref >=60)

## 2023-04-19 LAB — VITAMIN D 25 HYDROXY (VIT D DEFICIENCY, FRACTURES): Vit D, 25-Hydroxy: 40 ng/mL (ref 30–100)

## 2023-04-20 ENCOUNTER — Other Ambulatory Visit: Payer: Self-pay

## 2023-04-20 ENCOUNTER — Encounter: Payer: Self-pay | Admitting: Family Medicine

## 2023-04-20 DIAGNOSIS — N289 Disorder of kidney and ureter, unspecified: Secondary | ICD-10-CM

## 2023-05-17 ENCOUNTER — Encounter: Payer: Self-pay | Admitting: Plastic Surgery

## 2023-05-17 ENCOUNTER — Ambulatory Visit: Payer: 59 | Admitting: Plastic Surgery

## 2023-05-17 VITALS — BP 116/79 | HR 101 | Ht 65.0 in | Wt 152.4 lb

## 2023-05-17 DIAGNOSIS — R220 Localized swelling, mass and lump, head: Secondary | ICD-10-CM | POA: Diagnosis not present

## 2023-05-17 DIAGNOSIS — D489 Neoplasm of uncertain behavior, unspecified: Secondary | ICD-10-CM

## 2023-05-17 NOTE — Progress Notes (Signed)
 Referring Provider Caitlyn Hoit, MD [address] [place],  [place] [address]   CC:  Chief Complaint  Patient presents with   Advice Only      Caitlyn [name] is an 43 y.o. female.  HPI: Caitlyn [name] is a 43 year old female who presents today with a small mass on the right upper forehead.  She states that this has been there for at least 2 years and has been growing slowly over that period of time she would like to have the mass removed to ensure that there is no significant pathology.  She denies ongoing pain or any history of infection.  No Known Allergies  Outpatient Encounter Medications as of 05/17/2023  Medication Sig   Adapalene-Benzoyl Peroxide 0.1-2.5 % gel Apply topically daily.   buPROPion (WELLBUTRIN XL) 150 MG 24 hr tablet Take 150 mg by mouth daily.   hydrOXYzine (ATARAX) 10 MG tablet Take by mouth.   spironolactone (ALDACTONE) 50 MG tablet Take 50 mg by mouth 2 (two) times daily.   traZODone (DESYREL) 100 MG tablet Take 100 mg by mouth at bedtime.   No facility-administered encounter medications on file as of 05/17/2023.     Past Medical History:  Diagnosis Date   Anxiety    Depression     Past Surgical History:  Procedure Laterality Date   CESAREAN SECTION     CESAREAN SECTION N/A 02/09/2017   Procedure: CESAREAN SECTION;  Surgeon: Caitlyn Honour, DO;  Location: [facility];  Service: Obstetrics;  Laterality: N/A;  Repeat edc 03/31/17 NKDA need RNFA   WISDOM TOOTH EXTRACTION      Family History  Problem Relation Age of Onset   Diabetes Brother    Intellectual disability Brother    Diabetes Maternal Grandfather     Social History   Social History Narrative   Not on file     Review of Systems General: Denies fevers, chills, weight loss CV: Denies chest pain, shortness of breath, palpitations Skin: 2 x 2 cm mass at the left upper forehead.  No pain or drainage  Physical Exam    05/17/2023    8:59 AM 04/18/2023    7:58 AM  03/10/2023    9:35 AM  Vitals with BMI  Height 5\' 5"  5\' 5"    Weight 152 lbs 6 oz 153 lbs 2 oz   BMI 25.36 25.48   Systolic 116 120 540  Diastolic 79 72 69  Pulse 101 101     General:  No acute distress,  Alert and oriented, Non-Toxic, Normal speech and affect Integument: Patient has a 2 x 2 cm mass on the left upper forehead on activation of the frontalis muscle the mass appears to be under the muscle.  There is no pain to palpation. Mammogram: Not applicable Assessment/Plan Soft tissue mass: This likely represents either a lipoma or a sebaceous cyst.  I discussed removal with the patient.  I believe that this would be best done in the operating room due to the fact that it appears to be under the frontalis muscle.  I have explained this to her and she is in agreement.  She understands that there will be a scar after surgery and that there is a chance of recurrence.  All questions were answered to her satisfaction.  Photographs were obtained today with her consent.  Will schedule her for excision of the mass in the operating room at her request.  Caitlyn [name] 05/17/2023, 9:30 AM

## 2023-06-20 ENCOUNTER — Ambulatory Visit: Admitting: Surgical

## 2023-06-20 ENCOUNTER — Encounter: Payer: Self-pay | Admitting: Surgical

## 2023-06-20 VITALS — BP 107/74 | HR 85 | Ht 64.0 in | Wt 152.0 lb

## 2023-06-20 DIAGNOSIS — D489 Neoplasm of uncertain behavior, unspecified: Secondary | ICD-10-CM

## 2023-06-20 NOTE — H&P (View-Only) (Signed)
 [name], female    DOB: 1981-02-08, 43 y.o.   MRN: [id number]  Chief Complaint  Patient presents with   Pre-op Exam      ICD-10-CM   1. 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  female  with a history of forehead mass for multiple years.  She presents for preoperative evaluation for upcoming procedure, excision of left forehead mass, scheduled for 07/06/2023 with Dr. Ladona Ridgel.  The patient has not had problems with anesthesia. No history of DVT/PE.  No family history of DVT/PE.  No family or personal history of bleeding or clotting disorders.  Patient is not currently taking any blood thinners.  No history of CVA/MI.  Patient denies any cardiac or pulm disease.  She does not have any history of asthma or COPD.  Patient denies any chronic illnesses.   Summary of Previous Visit: Patient with small mass on right upper forehead.  Has been there for at least 2 years.  Slowly growing over that time.  PMH Significant for: Left forehead mass.  Patient reports she was able to read the consent form today, does not have any specific questions related to risks with surgery.  She does have questions about recovery.  Past Medical History: Allergies: No Known Allergies  Current Medications:  Current Outpatient Medications:    Adapalene-Benzoyl Peroxide 0.1-2.5 % gel, Apply topically daily., Disp: , Rfl:    buPROPion (WELLBUTRIN XL) 150 MG 24 hr tablet, Take 150 mg by mouth daily., Disp: , Rfl:    hydrOXYzine (ATARAX) 10 MG tablet, Take by mouth., Disp: , Rfl:    spironolactone (ALDACTONE) 50 MG tablet, Take 50 mg by mouth 2 (two) times daily. (Patient taking differently: Take 50 mg by mouth once.), Disp: , Rfl:    traZODone (DESYREL) 100 MG tablet, Take 100 mg by mouth at bedtime., Disp: , Rfl:   Past Medical Problems: Past Medical History:  Diagnosis Date   Anxiety    Depression     Past  Surgical History: Past Surgical History:  Procedure Laterality Date   CESAREAN SECTION     CESAREAN SECTION N/A 02/09/2017   Procedure: CESAREAN SECTION;  Surgeon: Mitchel Honour, DO;  Location: [facility];  Service: Obstetrics;  Laterality: N/A;  Repeat edc 03/31/17 NKDA need RNFA   WISDOM TOOTH EXTRACTION      Social History: Social History   Socioeconomic History   Marital status: Married    Spouse name: Not on file   Number of children: Not on file   Years of education: Not on file   Highest education level: Master's degree (e.g., MA, MS, MEng, MEd, MSW, MBA)  Occupational History   Not on file  Tobacco Use   Smoking status: Never   Smokeless tobacco: Never  Vaping Use   Vaping status: Never Used  Substance and Sexual Activity   Alcohol use: No   Drug use: No   Sexual activity: Yes    Birth control/protection: Condom  Other Topics Concern   Not on file  Social History Narrative   Not on file   Social Drivers of Health   Financial Resource Strain: Low Risk  (04/14/2023)   Overall Financial Resource Strain (CARDIA)    Difficulty of Paying Living Expenses: Not hard at all  Food Insecurity: No Food Insecurity (04/14/2023)   Hunger Vital Sign    Worried  About Running Out of Food in the Last Year: Never true    Ran Out of Food in the Last Year: Never true  Transportation Needs: No Transportation Needs (04/14/2023)   PRAPARE - Administrator, Civil Service (Medical): No    Lack of Transportation (Non-Medical): No  Physical Activity: Insufficiently Active (04/14/2023)   Exercise Vital Sign    Days of Exercise per Week: 2 days    Minutes of Exercise per Session: 30 min  Stress: Stress Concern Present (04/14/2023)   [employer] of Occupational Health - Occupational Stress Questionnaire    Feeling of Stress : Rather much  Social Connections: Unknown (04/14/2023)   Social Connection and Isolation Panel [NHANES]    Frequency of Communication with Friends  and Family: Once a week    Frequency of Social Gatherings with Friends and Family: Patient declined    Attends Religious Services: 1 to 4 times per year    Active Member of [employer] or Organizations: Yes    Attends Banker Meetings: 1 to 4 times per year    Marital Status: Married  Catering manager Violence: Not on file    Family History: Family History  Problem Relation Age of Onset   Diabetes Brother    Intellectual disability Brother    Diabetes Maternal Grandfather     Review of Systems: Review of Systems  Constitutional: Negative.   Respiratory: Negative.    Cardiovascular: Negative.   Gastrointestinal: Negative.   Neurological: Negative.     Physical Exam: Vital Signs BP 107/74 (BP Location: Left Arm, Patient Position: Sitting, Cuff Size: Large)   Pulse 85   Ht 5\' 4"  (1.626 m)   Wt 152 lb (68.9 kg)   SpO2 99%   BMI 26.09 kg/m   Physical Exam Constitutional:      General: Not in acute distress.    Appearance: Normal appearance. Not ill-appearing.  HENT:     Head: Normocephalic and atraumatic.  Left forehead mass noted. Eyes:     Pupils: Pupils are equal, round Neck:     Musculoskeletal: Normal range of motion.  Cardiovascular:     Rate and Rhythm: Normal rate    Pulses: Normal pulses.  Pulmonary:     Effort: Pulmonary effort is normal. No respiratory distress.  Abdominal:     General: Abdomen is flat. There is no distension.  Musculoskeletal: Normal range of motion.  Skin:    General: Skin is warm and dry.     Findings: No erythema or rash.  Neurological:     General: No focal deficit present.     Mental Status: Alert and oriented to person, place, and time. Mental status is at baseline.     Motor: No weakness.  Psychiatric:        Mood and Affect: Mood normal.        Behavior: Behavior normal.    Assessment/Plan: The patient is scheduled for excision of left forehead mass with Dr. Ladona Ridgel.  Risks, benefits, and alternatives of  procedure discussed, questions answered and consent obtained.    Smoking Status: non Smoker; Counseling Given?  N/A  Caprini Score: 3; Risk Factors include: BMI > 25, and length of planned surgery. Recommendation for mechanical prophylaxis. Encourage early ambulation.   Pictures obtained: @consult   Post-op Rx sent to pharmacy:  zofran, patient would like to use Tylenol for postoperative pain control.  Patient was provided with the General Surgical Risk consent document and Pain Medication Agreement prior to their appointment.  They had adequate time to read through the risk consent documents and Pain Medication Agreement. We also discussed them in person together during this preop appointment. All of their questions were answered to their satisfaction.  Recommended calling if they have any further questions.  Risk consent form and Pain Medication Agreement to be scanned into patient's chart.  The risks that can be encountered with and after excision of a skin lesion were discussed and include the following but not limited to these: bleeding, infection, delayed healing, anesthesia risks, skin sensation changes, injury to structures including nerves, blood vessels, and muscles which may be temporary or permanent, allergies to tape, suture materials and glues, blood products, topical preparations or injected agents, skin contour irregularities, skin discoloration and swelling, deep vein thrombosis, cardiac and pulmonary complications, pain, which may persist, persistent pain, recurrence of the lesion, poor healing of the incision, possible need for revisional surgery or staged procedures.    Electronically signed by: Kermit Balo Eavan Gonterman, PA-C 06/20/2023 8:53 AM

## 2023-06-20 NOTE — Progress Notes (Signed)
 [name], female    DOB: 1981-02-08, 43 y.o.   MRN: [id number]  Chief Complaint  Patient presents with   Pre-op Exam      ICD-10-CM   1. 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  female  with a history of forehead mass for multiple years.  She presents for preoperative evaluation for upcoming procedure, excision of left forehead mass, scheduled for 07/06/2023 with Dr. Ladona Ridgel.  The patient has not had problems with anesthesia. No history of DVT/PE.  No family history of DVT/PE.  No family or personal history of bleeding or clotting disorders.  Patient is not currently taking any blood thinners.  No history of CVA/MI.  Patient denies any cardiac or pulm disease.  She does not have any history of asthma or COPD.  Patient denies any chronic illnesses.   Summary of Previous Visit: Patient with small mass on right upper forehead.  Has been there for at least 2 years.  Slowly growing over that time.  PMH Significant for: Left forehead mass.  Patient reports she was able to read the consent form today, does not have any specific questions related to risks with surgery.  She does have questions about recovery.  Past Medical History: Allergies: No Known Allergies  Current Medications:  Current Outpatient Medications:    Adapalene-Benzoyl Peroxide 0.1-2.5 % gel, Apply topically daily., Disp: , Rfl:    buPROPion (WELLBUTRIN XL) 150 MG 24 hr tablet, Take 150 mg by mouth daily., Disp: , Rfl:    hydrOXYzine (ATARAX) 10 MG tablet, Take by mouth., Disp: , Rfl:    spironolactone (ALDACTONE) 50 MG tablet, Take 50 mg by mouth 2 (two) times daily. (Patient taking differently: Take 50 mg by mouth once.), Disp: , Rfl:    traZODone (DESYREL) 100 MG tablet, Take 100 mg by mouth at bedtime., Disp: , Rfl:   Past Medical Problems: Past Medical History:  Diagnosis Date   Anxiety    Depression     Past  Surgical History: Past Surgical History:  Procedure Laterality Date   CESAREAN SECTION     CESAREAN SECTION N/A 02/09/2017   Procedure: CESAREAN SECTION;  Surgeon: Mitchel Honour, DO;  Location: [facility];  Service: Obstetrics;  Laterality: N/A;  Repeat edc 03/31/17 NKDA need RNFA   WISDOM TOOTH EXTRACTION      Social History: Social History   Socioeconomic History   Marital status: Married    Spouse name: Not on file   Number of children: Not on file   Years of education: Not on file   Highest education level: Master's degree (e.g., MA, MS, MEng, MEd, MSW, MBA)  Occupational History   Not on file  Tobacco Use   Smoking status: Never   Smokeless tobacco: Never  Vaping Use   Vaping status: Never Used  Substance and Sexual Activity   Alcohol use: No   Drug use: No   Sexual activity: Yes    Birth control/protection: Condom  Other Topics Concern   Not on file  Social History Narrative   Not on file   Social Drivers of Health   Financial Resource Strain: Low Risk  (04/14/2023)   Overall Financial Resource Strain (CARDIA)    Difficulty of Paying Living Expenses: Not hard at all  Food Insecurity: No Food Insecurity (04/14/2023)   Hunger Vital Sign    Worried  About Running Out of Food in the Last Year: Never true    Ran Out of Food in the Last Year: Never true  Transportation Needs: No Transportation Needs (04/14/2023)   PRAPARE - Administrator, Civil Service (Medical): No    Lack of Transportation (Non-Medical): No  Physical Activity: Insufficiently Active (04/14/2023)   Exercise Vital Sign    Days of Exercise per Week: 2 days    Minutes of Exercise per Session: 30 min  Stress: Stress Concern Present (04/14/2023)   [employer] of Occupational Health - Occupational Stress Questionnaire    Feeling of Stress : Rather much  Social Connections: Unknown (04/14/2023)   Social Connection and Isolation Panel [NHANES]    Frequency of Communication with Friends  and Family: Once a week    Frequency of Social Gatherings with Friends and Family: Patient declined    Attends Religious Services: 1 to 4 times per year    Active Member of [employer] or Organizations: Yes    Attends Banker Meetings: 1 to 4 times per year    Marital Status: Married  Catering manager Violence: Not on file    Family History: Family History  Problem Relation Age of Onset   Diabetes Brother    Intellectual disability Brother    Diabetes Maternal Grandfather     Review of Systems: Review of Systems  Constitutional: Negative.   Respiratory: Negative.    Cardiovascular: Negative.   Gastrointestinal: Negative.   Neurological: Negative.     Physical Exam: Vital Signs BP 107/74 (BP Location: Left Arm, Patient Position: Sitting, Cuff Size: Large)   Pulse 85   Ht 5\' 4"  (1.626 m)   Wt 152 lb (68.9 kg)   SpO2 99%   BMI 26.09 kg/m   Physical Exam Constitutional:      General: Not in acute distress.    Appearance: Normal appearance. Not ill-appearing.  HENT:     Head: Normocephalic and atraumatic.  Left forehead mass noted. Eyes:     Pupils: Pupils are equal, round Neck:     Musculoskeletal: Normal range of motion.  Cardiovascular:     Rate and Rhythm: Normal rate    Pulses: Normal pulses.  Pulmonary:     Effort: Pulmonary effort is normal. No respiratory distress.  Abdominal:     General: Abdomen is flat. There is no distension.  Musculoskeletal: Normal range of motion.  Skin:    General: Skin is warm and dry.     Findings: No erythema or rash.  Neurological:     General: No focal deficit present.     Mental Status: Alert and oriented to person, place, and time. Mental status is at baseline.     Motor: No weakness.  Psychiatric:        Mood and Affect: Mood normal.        Behavior: Behavior normal.    Assessment/Plan: The patient is scheduled for excision of left forehead mass with Dr. Ladona Ridgel.  Risks, benefits, and alternatives of  procedure discussed, questions answered and consent obtained.    Smoking Status: non Smoker; Counseling Given?  N/A  Caprini Score: 3; Risk Factors include: BMI > 25, and length of planned surgery. Recommendation for mechanical prophylaxis. Encourage early ambulation.   Pictures obtained: @consult   Post-op Rx sent to pharmacy:  zofran, patient would like to use Tylenol for postoperative pain control.  Patient was provided with the General Surgical Risk consent document and Pain Medication Agreement prior to their appointment.  They had adequate time to read through the risk consent documents and Pain Medication Agreement. We also discussed them in person together during this preop appointment. All of their questions were answered to their satisfaction.  Recommended calling if they have any further questions.  Risk consent form and Pain Medication Agreement to be scanned into patient's chart.  The risks that can be encountered with and after excision of a skin lesion were discussed and include the following but not limited to these: bleeding, infection, delayed healing, anesthesia risks, skin sensation changes, injury to structures including nerves, blood vessels, and muscles which may be temporary or permanent, allergies to tape, suture materials and glues, blood products, topical preparations or injected agents, skin contour irregularities, skin discoloration and swelling, deep vein thrombosis, cardiac and pulmonary complications, pain, which may persist, persistent pain, recurrence of the lesion, poor healing of the incision, possible need for revisional surgery or staged procedures.    Electronically signed by: Kermit Balo Eavan Gonterman, PA-C 06/20/2023 8:53 AM

## 2023-06-28 ENCOUNTER — Encounter (HOSPITAL_BASED_OUTPATIENT_CLINIC_OR_DEPARTMENT_OTHER): Payer: Self-pay | Admitting: Plastic Surgery

## 2023-06-28 ENCOUNTER — Other Ambulatory Visit: Payer: Self-pay

## 2023-07-02 ENCOUNTER — Encounter (HOSPITAL_BASED_OUTPATIENT_CLINIC_OR_DEPARTMENT_OTHER)
Admission: RE | Admit: 2023-07-02 | Discharge: 2023-07-02 | Disposition: A | Discharge status: Home / Self Care | Source: Ambulatory Visit | Attending: Plastic Surgery | Admitting: Plastic Surgery

## 2023-07-02 DIAGNOSIS — Z01818 Encounter for other preprocedural examination: Secondary | ICD-10-CM | POA: Diagnosis present

## 2023-07-02 DIAGNOSIS — Z01812 Encounter for preprocedural laboratory examination: Secondary | ICD-10-CM | POA: Insufficient documentation

## 2023-07-02 LAB — BASIC METABOLIC PANEL WITH GFR
Anion gap: 7 (ref 5–15)
BUN: 12 mg/dL (ref 6–20)
CO2: 25 mmol/L (ref 22–32)
Calcium: 8.9 mg/dL (ref 8.9–10.3)
Chloride: 105 mmol/L (ref 98–111)
Creatinine, Ser: 1.08 mg/dL — ABNORMAL HIGH (ref 0.44–1.00)
GFR, Estimated: >60 mL/min (ref >60)
Glucose, Bld: 85 mg/dL (ref 70–99)
Potassium: 4.2 mmol/L (ref 3.5–5.1)
Sodium: 137 mmol/L (ref 135–145)

## 2023-07-02 LAB — POCT PREGNANCY, URINE: Preg Test, Ur: NEGATIVE

## 2023-07-02 MED ORDER — CHLORHEXIDINE GLUCONATE CLOTH 2 % EX PADS: 6.0000 | MEDICATED_PAD | Freq: Once | CUTANEOUS | Status: DC

## 2023-07-02 NOTE — Progress Notes (Signed)

## 2023-07-06 ENCOUNTER — Encounter (HOSPITAL_BASED_OUTPATIENT_CLINIC_OR_DEPARTMENT_OTHER)
Admission: RE | Disposition: A | Discharge status: Home / Self Care | Payer: Self-pay | Source: Home / Self Care | Attending: Plastic Surgery

## 2023-07-06 ENCOUNTER — Ambulatory Visit (HOSPITAL_BASED_OUTPATIENT_CLINIC_OR_DEPARTMENT_OTHER): Admitting: Anesthesiology

## 2023-07-06 ENCOUNTER — Encounter (HOSPITAL_BASED_OUTPATIENT_CLINIC_OR_DEPARTMENT_OTHER): Payer: Self-pay | Admitting: Plastic Surgery

## 2023-07-06 ENCOUNTER — Ambulatory Visit (HOSPITAL_BASED_OUTPATIENT_CLINIC_OR_DEPARTMENT_OTHER)
Admission: RE | Admit: 2023-07-06 | Discharge: 2023-07-06 | Disposition: A | Discharge status: Home / Self Care | Attending: Plastic Surgery | Admitting: Plastic Surgery

## 2023-07-06 ENCOUNTER — Other Ambulatory Visit: Payer: Self-pay

## 2023-07-06 DIAGNOSIS — D489 Neoplasm of uncertain behavior, unspecified: Secondary | ICD-10-CM | POA: Diagnosis present

## 2023-07-06 DIAGNOSIS — D170 Benign lipomatous neoplasm of skin and subcutaneous tissue of head, face and neck: Secondary | ICD-10-CM | POA: Insufficient documentation

## 2023-07-06 DIAGNOSIS — R220 Localized swelling, mass and lump, head: Secondary | ICD-10-CM | POA: Diagnosis not present

## 2023-07-06 DIAGNOSIS — N289 Disorder of kidney and ureter, unspecified: Secondary | ICD-10-CM

## 2023-07-06 DIAGNOSIS — Z79899 Other long term (current) drug therapy: Secondary | ICD-10-CM

## 2023-07-06 DIAGNOSIS — Z01818 Encounter for other preprocedural examination: Secondary | ICD-10-CM

## 2023-07-06 HISTORY — DX: Acne, unspecified: L70.9

## 2023-07-06 HISTORY — PX: EXCISION MASS HEAD: SHX6702

## 2023-07-06 SURGERY — EXCISION, MASS, HEAD
Anesthesia: General | Site: Head | Laterality: Left

## 2023-07-06 MED ORDER — LACTATED RINGERS IV SOLN: INTRAVENOUS | Status: DC

## 2023-07-06 MED ORDER — DROPERIDOL 2.5 MG/ML IJ SOLN: 0.6250 mg | Freq: Once | INTRAMUSCULAR | Status: DC | PRN

## 2023-07-06 MED ORDER — FENTANYL CITRATE (PF) 100 MCG/2ML IJ SOLN
INTRAMUSCULAR | Status: AC
  Filled 2023-07-06: qty 2

## 2023-07-06 MED ORDER — ATROPINE SULFATE 0.4 MG/ML IV SOLN
INTRAVENOUS | Status: AC
  Filled 2023-07-06: qty 1

## 2023-07-06 MED ORDER — ACETAMINOPHEN 10 MG/ML IV SOLN: 1000.0000 mg | Freq: Once | INTRAVENOUS | Status: DC | PRN

## 2023-07-06 MED ORDER — BUPIVACAINE-EPINEPHRINE 0.25% -1:200000 IJ SOLN
INTRAMUSCULAR | Status: DC | PRN
  Administered 2023-07-06: 3 mL

## 2023-07-06 MED ORDER — ONDANSETRON HCL 4 MG/2ML IJ SOLN
INTRAMUSCULAR | Status: DC | PRN
  Administered 2023-07-06: 4 mg via INTRAVENOUS

## 2023-07-06 MED ORDER — CEFAZOLIN SODIUM-DEXTROSE 2-4 GM/100ML-% IV SOLN
INTRAVENOUS | Status: AC
  Filled 2023-07-06: qty 100

## 2023-07-06 MED ORDER — ONDANSETRON HCL 4 MG/2ML IJ SOLN
INTRAMUSCULAR | Status: AC
  Filled 2023-07-06: qty 2

## 2023-07-06 MED ORDER — OXYCODONE HCL 5 MG PO TABS: 5.0000 mg | ORAL_TABLET | Freq: Once | ORAL | Status: DC | PRN

## 2023-07-06 MED ORDER — PHENYLEPHRINE 80 MCG/ML (10ML) SYRINGE FOR IV PUSH (FOR BLOOD PRESSURE SUPPORT)
PREFILLED_SYRINGE | INTRAVENOUS | Status: AC
  Filled 2023-07-06: qty 10

## 2023-07-06 MED ORDER — SUCCINYLCHOLINE CHLORIDE 200 MG/10ML IV SOSY
PREFILLED_SYRINGE | INTRAVENOUS | Status: AC
Start: 2023-07-06 — End: ?
  Filled 2023-07-06: qty 10

## 2023-07-06 MED ORDER — LIDOCAINE HCL (CARDIAC) PF 100 MG/5ML IV SOSY
PREFILLED_SYRINGE | INTRAVENOUS | Status: DC | PRN
  Administered 2023-07-06: 60 mg via INTRAVENOUS

## 2023-07-06 MED ORDER — ACETAMINOPHEN 500 MG PO TABS
ORAL_TABLET | ORAL | Status: AC
  Filled 2023-07-06: qty 2

## 2023-07-06 MED ORDER — FENTANYL CITRATE (PF) 100 MCG/2ML IJ SOLN: 25.0000 ug | INTRAMUSCULAR | Status: DC | PRN

## 2023-07-06 MED ORDER — EPHEDRINE 5 MG/ML INJ
INTRAVENOUS | Status: AC
  Filled 2023-07-06: qty 5

## 2023-07-06 MED ORDER — MIDAZOLAM HCL 2 MG/2ML IJ SOLN
INTRAMUSCULAR | Status: AC
  Filled 2023-07-06: qty 2

## 2023-07-06 MED ORDER — OXYCODONE HCL 5 MG/5ML PO SOLN: 5.0000 mg | Freq: Once | ORAL | Status: DC | PRN

## 2023-07-06 MED ORDER — SCOPOLAMINE 1 MG/3DAYS TD PT72
1.0000 | MEDICATED_PATCH | TRANSDERMAL | Status: DC
Start: 2023-07-06 — End: 2023-07-06
  Administered 2023-07-06: 1.5 mg via TRANSDERMAL

## 2023-07-06 MED ORDER — LIDOCAINE 2% (20 MG/ML) 5 ML SYRINGE
INTRAMUSCULAR | Status: AC
  Filled 2023-07-06: qty 5

## 2023-07-06 MED ORDER — PROPOFOL 10 MG/ML IV BOLUS
INTRAVENOUS | Status: DC | PRN
  Administered 2023-07-06: 200 mg via INTRAVENOUS

## 2023-07-06 MED ORDER — SCOPOLAMINE 1 MG/3DAYS TD PT72
MEDICATED_PATCH | TRANSDERMAL | Status: AC
  Filled 2023-07-06: qty 1

## 2023-07-06 MED ORDER — DEXAMETHASONE SODIUM PHOSPHATE 4 MG/ML IJ SOLN
INTRAMUSCULAR | Status: DC | PRN
  Administered 2023-07-06: 5 mg via INTRAVENOUS

## 2023-07-06 MED ORDER — KETOROLAC TROMETHAMINE 30 MG/ML IJ SOLN
INTRAMUSCULAR | Status: DC | PRN
  Administered 2023-07-06: 30 mg via INTRAVENOUS

## 2023-07-06 MED ORDER — DEXAMETHASONE SODIUM PHOSPHATE 10 MG/ML IJ SOLN
INTRAMUSCULAR | Status: AC
  Filled 2023-07-06: qty 1

## 2023-07-06 MED ORDER — MIDAZOLAM HCL 5 MG/5ML IJ SOLN
INTRAMUSCULAR | Status: DC | PRN
  Administered 2023-07-06: 2 mg via INTRAVENOUS

## 2023-07-06 MED ORDER — FENTANYL CITRATE (PF) 100 MCG/2ML IJ SOLN
INTRAMUSCULAR | Status: DC | PRN
Start: 2023-07-06 — End: 2023-07-06
  Administered 2023-07-06: 100 ug via INTRAVENOUS

## 2023-07-06 MED ORDER — ACETAMINOPHEN 500 MG PO TABS
1000.0000 mg | ORAL_TABLET | Freq: Once | ORAL | Status: AC
  Administered 2023-07-06: 1000 mg via ORAL

## 2023-07-06 SURGICAL SUPPLY — 72 items
BAND RUBBER #18 3X1/16 STRL (MISCELLANEOUS) IMPLANT
BENZOIN TINCTURE PRP APPL 2/3 (GAUZE/BANDAGES/DRESSINGS) IMPLANT
BLADE CLIPPER SURG (BLADE) IMPLANT
BLADE SURG 15 STRL LF DISP TIS (BLADE) ×1 IMPLANT
CANISTER SUCT 1200ML W/VALVE (MISCELLANEOUS) IMPLANT
CHLORAPREP W/TINT 26 (MISCELLANEOUS) ×1 IMPLANT
COVER BACK TABLE 60X90IN (DRAPES) ×1 IMPLANT
COVER MAYO STAND STRL (DRAPES) ×1 IMPLANT
DERMABOND ADVANCED .7 DNX12 (GAUZE/BANDAGES/DRESSINGS) IMPLANT
DRAIN WOUND RND W/TROCAR (DRAIN) IMPLANT
DRAPE LAPAROTOMY 100X72 PEDS (DRAPES) IMPLANT
DRAPE U-SHAPE 76X120 STRL (DRAPES) IMPLANT
DRAPE UTILITY XL STRL (DRAPES) ×1 IMPLANT
DRSG TELFA 3X8 NADH STRL (GAUZE/BANDAGES/DRESSINGS) IMPLANT
ELECT COATED BLADE 2.86 ST (ELECTRODE) IMPLANT
ELECT NDL BLADE 2-5/6 (NEEDLE) ×1 IMPLANT
ELECT NEEDLE BLADE 2-5/6 (NEEDLE) ×1 IMPLANT
ELECTRODE REM PT RETRN 9FT PED (ELECTROSURGICAL) IMPLANT
ELECTRODE REM PT RTRN 9FT ADLT (ELECTROSURGICAL) IMPLANT
EVACUATOR SILICONE 100CC (DRAIN) IMPLANT
GAUZE SPONGE 2X2 STRL 8-PLY (GAUZE/BANDAGES/DRESSINGS) IMPLANT
GAUZE SPONGE 4X4 12PLY STRL LF (GAUZE/BANDAGES/DRESSINGS) IMPLANT
GAUZE XEROFORM 1X8 LF (GAUZE/BANDAGES/DRESSINGS) IMPLANT
GLOVE BIO SURGEON STRL SZ 6.5 (GLOVE) IMPLANT
GLOVE BIO SURGEON STRL SZ7.5 (GLOVE) IMPLANT
GLOVE BIO SURGEON STRL SZ8 (GLOVE) ×1 IMPLANT
GLOVE BIOGEL PI IND STRL 7.0 (GLOVE) IMPLANT
GLOVE BIOGEL PI IND STRL 8 (GLOVE) IMPLANT
GLOVE SURG SS PI 6.5 STRL IVOR (GLOVE) IMPLANT
GOWN STRL REUS W/ TWL LRG LVL3 (GOWN DISPOSABLE) ×1 IMPLANT
GOWN STRL REUS W/ TWL XL LVL3 (GOWN DISPOSABLE) IMPLANT
GOWN STRL REUS W/TWL XL LVL3 (GOWN DISPOSABLE) ×1 IMPLANT
HIBICLENS CHG 4% 4OZ BTL (MISCELLANEOUS) ×1 IMPLANT
MARKER SKIN DUAL TIP RULER LAB (MISCELLANEOUS) IMPLANT
NDL FILTER BLUNT 18X1 1/2 (NEEDLE) IMPLANT
NDL HYPO 30GX1 BEV (NEEDLE) IMPLANT
NDL PRECISIONGLIDE 27X1.5 (NEEDLE) ×1 IMPLANT
NDL SAFETY ECLIPSE 18X1.5 (NEEDLE) IMPLANT
NEEDLE FILTER BLUNT 18X1 1/2 (NEEDLE) IMPLANT
NEEDLE HYPO 30GX1 BEV (NEEDLE) IMPLANT
NEEDLE PRECISIONGLIDE 27X1.5 (NEEDLE) ×1 IMPLANT
NS IRRIG 1000ML POUR BTL (IV SOLUTION) IMPLANT
PACK BASIN DAY SURGERY FS (CUSTOM PROCEDURE TRAY) ×1 IMPLANT
PACK UNIVERSAL I (CUSTOM PROCEDURE TRAY) IMPLANT
PENCIL SMOKE EVACUATOR (MISCELLANEOUS) ×1 IMPLANT
SHEET MEDIUM DRAPE 40X70 STRL (DRAPES) IMPLANT
SLEEVE SCD COMPRESS KNEE MED (STOCKING) IMPLANT
SPONGE T-LAP 18X18 ~~LOC~~+RFID (SPONGE) IMPLANT
STAPLER SKIN PROX WIDE 3.9 (STAPLE) ×1 IMPLANT
STRIP CLOSURE SKIN 1/2X4 (GAUZE/BANDAGES/DRESSINGS) IMPLANT
SUCTION TUBE FRAZIER 10FR DISP (SUCTIONS) IMPLANT
SUT ETHILON 4 0 PS 2 18 (SUTURE) IMPLANT
SUT MNCRL AB 3-0 PS2 27 (SUTURE) IMPLANT
SUT MNCRL AB 4-0 PS2 18 (SUTURE) IMPLANT
SUT MON AB 5-0 P3 18 (SUTURE) IMPLANT
SUT PDS 3-0 CT2 (SUTURE) IMPLANT
SUT PDS II 3-0 CT2 27 ABS (SUTURE) IMPLANT
SUT PROLENE 5 0 P 3 (SUTURE) IMPLANT
SUT PROLENE 5 0 PS 2 (SUTURE) IMPLANT
SUT STRATA 3-0 60 PS-1 (SUTURE) IMPLANT
SUT VIC AB 3-0 SH 27X BRD (SUTURE) IMPLANT
SUT VIC AB 4-0 PS2 18 (SUTURE) IMPLANT
SUT VICRYL RAPIDE 4-0 (SUTURE) IMPLANT
SWAB COLLECTION DEVICE MRSA (MISCELLANEOUS) IMPLANT
SWAB CULTURE ESWAB REG 1ML (MISCELLANEOUS) IMPLANT
SYR 50ML LL SCALE MARK (SYRINGE) IMPLANT
SYR BULB EAR ULCER 3OZ GRN STR (SYRINGE) IMPLANT
SYR CONTROL 10ML LL (SYRINGE) ×1 IMPLANT
TOWEL GREEN STERILE FF (TOWEL DISPOSABLE) ×1 IMPLANT
TRAY DSU PREP LF (CUSTOM PROCEDURE TRAY) IMPLANT
TUBE CONNECTING 20X1/4 (TUBING) IMPLANT
YANKAUER SUCT BULB TIP NO VENT (SUCTIONS) IMPLANT

## 2023-07-06 NOTE — Op Note (Signed)
 DATE OF OPERATION: 07/06/2023  LOCATION: [facility] operating Room  PREOPERATIVE DIAGNOSIS: Left forehead mass  POSTOPERATIVE DIAGNOSIS: Same  PROCEDURE: Excision of left forehead mass  SURGEON: Kurtis Philips, MD  ASSISTANT:   EBL: 5 cc  CONDITION: Stable  COMPLICATIONS: None  INDICATION: The patient, Caitlyn [name], is a 43 y.o. female born on 06-May-1980, is here for  removal of the mass for pathologic diagnosis.   PROCEDURE DETAILS:  The patient was seen prior to surgery and marked.  No IV antibiotics were given. The patient was taken to the operating room and given a general anesthetic. A standard time out was performed and all information was confirmed by those in the room. SCDs were placed.   The forehead was prepped and draped in the usual sterile manner.  A 2 cm incision was made over the mass and a combination of sharp and blunt dissection was used to isolate and remove the mass.  The tissue was sent to pathology for routine examination.  The surgical site was irrigated.  No bleeding was noted.  The deep tissue was were approximated with interrupted 5-0 Monocryl sutures and the skin was closed with interrupted 5-0 Prolene sutures.  The length of the incision was 2 cm and the mass was approximately 1.5 cm across. The patient was allowed to wake up and taken to recovery room in stable condition at the end of the case. The family was notified at the end of the case.

## 2023-07-06 NOTE — Anesthesia Postprocedure Evaluation (Signed)
 Anesthesia Post Note  Patient: Caitlyn [name]  Procedure(s) Performed: EXCISION LEFT FOREHEAD MASS (Left: Head)     Patient location during evaluation: PACU Anesthesia Type: General Level of consciousness: awake and alert Pain management: pain level controlled Vital Signs Assessment: post-procedure vital signs reviewed and stable Respiratory status: spontaneous breathing, nonlabored ventilation, respiratory function stable and patient connected to nasal cannula oxygen Cardiovascular status: blood pressure returned to baseline and stable Postop Assessment: no apparent nausea or vomiting Anesthetic complications: no  No notable events documented.  Last Vitals:  Vitals:   07/06/23 1430 07/06/23 1454  BP: 107/65 (!) 105/59  Pulse: 82 86  Resp: 15 16  Temp:  (!) 36.3 C  SpO2: 100% 99%    Last Pain:  Vitals:   07/06/23 1454  TempSrc:   PainSc: 0-No pain                 Willian Harrow

## 2023-07-06 NOTE — Transfer of Care (Signed)
 Immediate Anesthesia Transfer of Care Note  Patient: Caitlyn [name]  Procedure(s) Performed: EXCISION LEFT FOREHEAD MASS (Left: Head)  Patient Location: PACU  Anesthesia Type:General  Level of Consciousness: awake, alert , oriented, drowsy, and patient cooperative  Airway & Oxygen Therapy: Patient Spontanous Breathing and Patient connected to face mask oxygen  Post-op Assessment: Report given to RN and Post -op Vital signs reviewed and stable  Post vital signs: Reviewed and stable  Last Vitals:  Vitals Value Taken Time  BP    Temp    Pulse    Resp 13 07/06/23 1421  SpO2    Vitals shown include unfiled device data.  Last Pain:  Vitals:   07/06/23 1154  TempSrc: Temporal  PainSc: 0-No pain      Patients Stated Pain Goal: 5 (07/06/23 1154)  Complications: No notable events documented.

## 2023-07-06 NOTE — Anesthesia Preprocedure Evaluation (Addendum)
 Anesthesia Evaluation  Patient identified by MRN, date of birth, ID band Patient awake    Reviewed: Allergy & Precautions, NPO status , Patient's Chart, lab work & pertinent test results  Airway Mallampati: I  TM Distance: >3 FB Neck ROM: Full    Dental  (+) Teeth Intact, Dental Advisory Given   Pulmonary neg pulmonary ROS   breath sounds clear to auscultation       Cardiovascular negative cardio ROS Normal cardiovascular exam     Neuro/Psych  PSYCHIATRIC DISORDERS Anxiety Depression    negative neurological ROS     GI/Hepatic negative GI ROS, Neg liver ROS,,,  Endo/Other  negative endocrine ROS    Renal/GU negative Renal ROS     Musculoskeletal negative musculoskeletal ROS (+)    Abdominal   Peds  Hematology negative hematology ROS (+)   Anesthesia Other Findings   Reproductive/Obstetrics                             Anesthesia Physical Anesthesia Plan  ASA: 2  Anesthesia Plan: General   Post-op Pain Management: Tylenol  PO (pre-op)* and Toradol  IV (intra-op)*   Induction: Intravenous  PONV Risk Score and Plan: 4 or greater and Ondansetron , Dexamethasone , Scopolamine  patch - Pre-op and Midazolam   Airway Management Planned: LMA  Additional Equipment: None  Intra-op Plan:   Post-operative Plan: Extubation in OR  Informed Consent: I have reviewed the patients History and Physical, chart, labs and discussed the procedure including the risks, benefits and alternatives for the proposed anesthesia with the patient or authorized representative who has indicated his/her understanding and acceptance.     Dental advisory given  Plan Discussed with: CRNA  Anesthesia Plan Comments:        Anesthesia Quick Evaluation

## 2023-07-06 NOTE — Discharge Instructions (Addendum)
 Diet: High protein, low sugar.  Medications: Please take the Zofran  as needed for nausea symptoms.  Pain control with over-the-counter medications, as directed on the bottle.  Wound care: You may leave the dressing on. If it becomes saturated or falls off, you can replace it with similar bandage.   Activity: As tolerated.  Mild drainage from underneath the dressing is OK. However, please call the office should you have severe pain or swelling, surrounding redness or malodor, wound dehiscence (if it completely opens), or if you have any fevers.     No tylenol /ibuprofen  until 8pm   Post Anesthesia Home Care Instructions  Activity: Get plenty of rest for the remainder of the day. A responsible individual must stay with you for 24 hours following the procedure.  For the next 24 hours, DO NOT: -Drive a car -Advertising copywriter -Drink alcoholic beverages -Take any medication unless instructed by your physician -Make any legal decisions or sign important papers.  Meals: Start with liquid foods such as gelatin or soup. Progress to regular foods as tolerated. Avoid greasy, spicy, heavy foods. If nausea and/or vomiting occur, drink only clear liquids until the nausea and/or vomiting subsides. Call your physician if vomiting continues.  Special Instructions/Symptoms: Your throat may feel dry or sore from the anesthesia or the breathing tube placed in your throat during surgery. If this causes discomfort, gargle with warm salt water. The discomfort should disappear within 24 hours.  If you had a scopolamine  patch placed behind your ear for the management of post- operative nausea and/or vomiting:  1. The medication in the patch is effective for 72 hours, after which it should be removed.  Wrap patch in a tissue and discard in the trash. Wash hands thoroughly with soap and water. 2. You may remove the patch earlier than 72 hours if you experience unpleasant side effects which may include dry mouth,  dizziness or visual disturbances. 3. Avoid touching the patch. Wash your hands with soap and water after contact with the patch.

## 2023-07-06 NOTE — Interval H&P Note (Signed)
 History and Physical Interval Note: No change in exam or indication for surgery All questions answered Consent changed from right forehead to left forehead and site marked and confirmed with patient Will proceed with excision of left forehead mass at her request  07/06/2023 11:49 AM  Caitlyn [name]  has presented today for surgery, with the diagnosis of Unclassified tumor, uncertain whether benign or malignant.  The various methods of treatment have been discussed with the patient and family. After consideration of risks, benefits and other options for treatment, the patient has consented to  Procedure(s) with comments: EXCISION, MASS, HEAD (Left) - Excision forehead mass, left side as a surgical intervention.  The patient's history has been reviewed, patient examined, no change in status, stable for surgery.  I have reviewed the patient's chart and labs.  Questions were answered to the patient's satisfaction.     Teretha Ferguson

## 2023-07-06 NOTE — Anesthesia Procedure Notes (Signed)
 Procedure Name: LMA Insertion Date/Time: 07/06/2023 1:45 PM  Performed by: Eugenia Hess, CRNAPre-anesthesia Checklist: Patient identified, Emergency Drugs available, Suction available and Patient being monitored Patient Re-evaluated:Patient Re-evaluated prior to induction Oxygen Delivery Method: Circle System Utilized Preoxygenation: Pre-oxygenation with 100% oxygen Induction Type: IV induction Ventilation: Mask ventilation without difficulty LMA: LMA inserted LMA Size: 4.0 Number of attempts: 1 Airway Equipment and Method: bite block Placement Confirmation: positive ETCO2 Tube secured with: Tape Dental Injury: Teeth and Oropharynx as per pre-operative assessment

## 2023-07-07 ENCOUNTER — Encounter (HOSPITAL_BASED_OUTPATIENT_CLINIC_OR_DEPARTMENT_OTHER): Payer: Self-pay | Admitting: Plastic Surgery

## 2023-07-09 LAB — POCT PREGNANCY, URINE: Preg Test, Ur: NEGATIVE

## 2023-07-09 LAB — SURGICAL PATHOLOGY

## 2023-07-12 ENCOUNTER — Encounter: Payer: Self-pay | Admitting: Plastic Surgery

## 2023-07-12 ENCOUNTER — Ambulatory Visit: Admitting: Plastic Surgery

## 2023-07-12 VITALS — BP 108/73 | HR 103 | Ht 65.0 in | Wt 157.0 lb

## 2023-07-12 DIAGNOSIS — D170 Benign lipomatous neoplasm of skin and subcutaneous tissue of head, face and neck: Secondary | ICD-10-CM

## 2023-07-12 NOTE — Progress Notes (Signed)
 Ms. Caitlyn [name] returns today for suture removal approximately 6 days postop from surgical removal of a soft tissue mass on her forehead.  She states she is doing well with no specific complaints from the surgery.  Surgical site looks good with the incision clean dry and intact.  Sutures removed without difficulty.   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  We discussed scar massage and sun avoidance.  She may use a silicone based scar cream if desired.  No need for scheduled follow-ups but she may return at anytime for any questions or concerns.

## 2023-07-26 ENCOUNTER — Encounter: Admitting: Surgical

## 2023-12-24 ENCOUNTER — Ambulatory Visit: Payer: 59 | Admitting: Family Medicine

## 2023-12-24 ENCOUNTER — Encounter: Payer: Self-pay | Admitting: Family Medicine

## 2023-12-24 VITALS — BP 110/62 | HR 94 | Temp 98.1°F | Ht 65.0 in | Wt 159.4 lb

## 2023-12-24 DIAGNOSIS — F320 Major depressive disorder, single episode, mild: Secondary | ICD-10-CM

## 2023-12-24 DIAGNOSIS — E282 Polycystic ovarian syndrome: Secondary | ICD-10-CM

## 2023-12-24 DIAGNOSIS — Z0001 Encounter for general adult medical examination with abnormal findings: Secondary | ICD-10-CM | POA: Diagnosis not present

## 2023-12-24 DIAGNOSIS — Z23 Encounter for immunization: Secondary | ICD-10-CM

## 2023-12-24 DIAGNOSIS — G4700 Insomnia, unspecified: Secondary | ICD-10-CM | POA: Diagnosis not present

## 2023-12-24 DIAGNOSIS — N912 Amenorrhea, unspecified: Secondary | ICD-10-CM

## 2023-12-24 DIAGNOSIS — Z0000 Encounter for general adult medical examination without abnormal findings: Secondary | ICD-10-CM

## 2023-12-24 NOTE — Progress Notes (Signed)
 Complete physical exam  Assessment & Plan:    Routine Health Maintenance and Physical Exam Discussed health benefits of physical activity, and encouraged her to engage in regular exercise appropriate for her age and condition.  Preventative health care -     CBC -     TSH -     Lipid panel -     Comprehensive metabolic panel with GFR  Immunization due -     Flu vaccine trivalent PF, 6mos and older(Flulaval,Afluria,Fluarix,Fluzone)  Current mild episode of major depressive disorder, unspecified whether recurrent  Amenorrhea -     FSH/LH -     Estradiol  Polycystic ovaries  Insomnia, unspecified type   Recommend healthy diet i.e mediterranean/DASH diet, consistent exercise - 30 minutes 5 day per week, and gradual weight loss. Follow-up on lab work and notify patient.  Continue healthy diet and start gradual exercise program.  If patient is in the menopausal range may need to send her to OB/GYN for further evaluation.  Patient currently not having any menopausal symptoms.  Patient will discuss with her psychiatrist regarding worsening on depression/mood issues.  Flu shot given today.  Patient will double check on her tetanus status. Return if symptoms worsen or fail to improve.        Subjective:  [name], female    DOB: Mar 27, 1980  Age: 43 y.o. MRN: [id number] Chief Complaint  Patient presents with   Annual Exam    Caitlyn [name] is a 43 y.o. female who presents today for a complete physical exam. Colonoscopy - not due yet Tetanus UTD 7 years ago but will doublecheck to see if she got with last pregnancy.  Pap smear end of 2022 negative HPV, no history of abnormal pap smears. Did receive HPV vaccines History of Present Illness Caitlyn [name] is a 43 year old female with polycystic ovarian syndrome who presents for a routine follow-up and evaluation of menstrual irregularities, and here for complete physical exam   She has not experienced menstruation  since August of the previous year, which she attributes to a weight gain of at least fifteen pounds. She has a history of polycystic ovarian syndrome and reported irregular periods before having children, but after losing weight, her cycles became regular every 28 days. No hot flashes, breast tenderness, or cramping, which she used to have before her periods. She notes changes in cervical mucus but no other symptoms.  She is currently taking Wellbutrin  45 mg, which she has been on for a long time and feels it helps. However, she notes increased difficulty in the last few months, possibly due to stress and demands at work and home life. She also takes Trazodone  50 mg for sleep, which allows her to sleep about eight hours. She does take 100mg  on Sunday nights. She experiences difficulty sleeping if she forgets to take it.  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 She finds it difficult to maintain a healthy lifestyle due to her busy schedule, which includes managing her children's activities and household responsibilities.  Her family history includes her mother, who started menopause early and has had benign breast masses. Her father suffers from gout. There is no known family history of cancer, diabetes, or hypertension.  Physical Exam ABDOMEN: Abdomen non-tender  Assessment and Plan Depression Chronic depression managed with Wellbutrin  450 mg. Reports increased stress possibly affecting mood. Current dosage may not be effective. - Discuss medication  adjustment with psychiatrist Dr. Dansie (via telemedicine). - Consider reducing Wellbutrin  and adding another medication if needed.  Insomnia Chronic insomnia managed with Trazodone  50 mg. Reports effective sleep with current regimen, achieving approximately 8 hours per night. Increased anxiety on Sunday nights managed with full dose. - Continue Trazodone  50 mg, adjust dosage as needed for  anxiety on Sunday nights.  Secondary amenorrhea with polycystic ovary syndrome (early menoapause?) Amenorrhea for over a year, possibly related to weight gain or early menopause. Family history of early menopause. No symptoms of menopause such as hot flashes. Polycystic ovary syndrome with previous irregular periods. - Order [facility] test to evaluate for menopause.  General Health Maintenance Routine health maintenance discussed. Mammogram due in December. Tetanus vaccination status unclear but likely up to date. Pap smear and HPV testing normal in 2022. Discussed importance of calcium and vitamin D  supplementation for bone health. - Schedule mammogram in December. - Verify tetanus vaccination status. - Encourage vitamin D  supplementation. - Encourage calcium intake through diet or supplements. Health Maintenance  Topic Date Due   HIV Screening  Never done   Hepatitis C Screening  Never done   DTaP/Tdap/Td vaccine (3 - Td or Tdap) 08/22/2023   COVID-19 Vaccine (1) 01/09/2024*   Hepatitis B Vaccine (1 of 3 - 19+ 3-dose series) 12/23/2024*   HPV Vaccine (1 - Risk 3-dose SCDM series) 12/23/2024*   Breast Cancer Screening  02/21/2025   Pap with HPV screening  01/03/2026   Flu Shot  Completed   Pneumococcal Vaccine  Aged Out   Meningitis B Vaccine  Aged Out  *Topic was postponed. The date shown is not the original due date.    Most recent fall risk assessment:    04/18/2023    8:05 AM  Fall Risk   Falls in the past year? 0  Number falls in past yr: 0  Injury with Fall? 0     Most recent depression screenings:    04/18/2023    8:05 AM  PHQ 2/9 Scores  PHQ - 2 Score 0      The 10-year ASCVD risk score (Arnett DK, et al., 2019) is: 0.3%   Values used to calculate the score:     Age: 43 years     Clincally relevant sex: Female     Is Non-Hispanic African American: No     Diabetic: No     Tobacco smoker: No     Systolic Blood Pressure: 110 mmHg     Is BP treated: No     HDL  Cholesterol: 59 mg/dL     Total Cholesterol: 169 mg/dL  Past Surgical History:  Procedure Laterality Date   CESAREAN SECTION     CESAREAN SECTION N/A 02/09/2017   Procedure: CESAREAN SECTION;  Surgeon: Dannielle Bouchard, DO;  [facility]: [facility];  Service: Obstetrics;  Laterality: N/A;  Repeat edc 03/31/17 NKDA need RNFA   DILATION AND CURETTAGE OF UTERUS     EXCISION MASS HEAD Left 07/06/2023   Procedure: EXCISION LEFT FOREHEAD MASS;  Surgeon: Waddell Leonce NOVAK, MD;  Location: [facility];  Service: Plastics;  Laterality: Left;  Excision forehead mass, left side   WISDOM TOOTH EXTRACTION     Social History   Tobacco Use   Smoking status: Never   Smokeless tobacco: Never  Vaping Use   Vaping status: Never Used  Substance Use Topics   Alcohol use: No   Drug use: No   Social History   Socioeconomic History  Marital status: Married    Spouse name: Not on file   Number of children: Not on file   Years of education: Not on file   Highest education level: Master's degree (e.g., MA, MS, MEng, MEd, MSW, MBA)  Occupational History   Not on file  Tobacco Use   Smoking status: Never   Smokeless tobacco: Never  Vaping Use   Vaping status: Never Used  Substance and Sexual Activity   Alcohol use: No   Drug use: No   Sexual activity: Yes    Birth control/protection: Condom  Other Topics Concern   Not on file  Social History Narrative   Married, has 2 kids (both were premies). Patient is eating healthy but unable to exercise consistently.    Social Drivers of Corporate investment banker Strain: Low Risk  (12/20/2023)   Overall Financial Resource Strain (CARDIA)    Difficulty of Paying Living Expenses: Not hard at all  Food Insecurity: No Food Insecurity (12/20/2023)   Hunger Vital Sign    Worried About Running Out of Food in the Last Year: Never true    Ran Out of Food in the Last Year: Never true  Transportation Needs: No Transportation Needs (12/20/2023)    PRAPARE - Administrator, Civil Service (Medical): No    Lack of Transportation (Non-Medical): No  Physical Activity: Inactive (12/20/2023)   Exercise Vital Sign    Days of Exercise per Week: 0 days    Minutes of Exercise per Session: Not on file  Stress: Stress Concern Present (12/20/2023)   [employer] of Occupational Health - Occupational Stress Questionnaire    Feeling of Stress: Rather much  Social Connections: Moderately Integrated (12/20/2023)   Social Connection and Isolation Panel    Frequency of Communication with Friends and Family: Twice a week    Frequency of Social Gatherings with Friends and Family: Once a week    Attends Religious Services: 1 to 4 times per year    Active Member of [employer] or Organizations: No    Attends Engineer, structural: Not on file    Marital Status: Married  Catering manager Violence: Not on file   Family History  Problem Relation Age of Onset   Premature ovarian failure Mother    Gout Father    Diabetes Brother    Intellectual disability Brother    Hypertension Brother    Diabetes Maternal Grandfather    Breast cancer Neg Hx    Uterine cancer Neg Hx    Colon cancer Neg Hx    Ovarian cancer Neg Hx    No Known Allergies   Patient Care Team: Aletha Bene, MD as PCP - General (Family Medicine)   Outpatient Medications Prior to Visit  Medication Sig   Adapalene-Benzoyl Peroxide 0.1-2.5 % gel Apply topically daily.   buPROPion  (WELLBUTRIN  XL) 150 MG 24 hr tablet Take 450 mg by mouth daily.   hydrOXYzine (ATARAX) 10 MG tablet Take by mouth.   spironolactone (ALDACTONE) 50 MG tablet Take 50 mg by mouth 2 (two) times daily.   traZODone  (DESYREL ) 100 MG tablet Take 100 mg by mouth at bedtime.   [DISCONTINUED] Cholecalciferol (VITAMIN D3) 50 MCG (2000 UT) CAPS Take by mouth.   No facility-administered medications prior to visit.    ROS     Objective:    BP 110/62   Pulse 94   Temp 98.1 F (36.7 C)   Ht  5' 5 (1.651 m)   Wt 159  lb 6 oz (72.3 kg)   SpO2 98%   BMI 26.52 kg/m  BP Readings from Last 3 Encounters:  12/24/23 110/62  07/12/23 108/73  07/06/23 (!) 105/59   Wt Readings from Last 3 Encounters:  12/24/23 159 lb 6 oz (72.3 kg)  07/12/23 157 lb (71.2 kg)  07/06/23 156 lb 15.5 oz (71.2 kg)    Physical Exam Vitals and nursing note reviewed.  Constitutional:      General: She is not in acute distress.    Appearance: Normal appearance.  HENT:     Head: Normocephalic.     Right Ear: Tympanic membrane, ear canal and external ear normal.     Left Ear: Tympanic membrane, ear canal and external ear normal.  Eyes:     Extraocular Movements: Extraocular movements intact.     Conjunctiva/sclera: Conjunctivae normal.     Pupils: Pupils are equal, round, and reactive to light.  Cardiovascular:     Rate and Rhythm: Normal rate and regular rhythm.     Heart sounds: Normal heart sounds.  Pulmonary:     Effort: Pulmonary effort is normal.     Breath sounds: Normal breath sounds. No wheezing.  Chest:  Breasts:    Right: Normal. No nipple discharge, skin change or tenderness.     Left: Normal. No nipple discharge, skin change or tenderness.  Abdominal:     General: Bowel sounds are normal.     Tenderness: There is no abdominal tenderness.  Musculoskeletal:        General: Normal range of motion.     Right lower leg: No edema.     Left lower leg: No edema.  Neurological:     General: No focal deficit present.     Mental Status: She is alert and oriented to person, place, and time. Mental status is at baseline.     Cranial Nerves: Cranial nerves 2-12 are intact. No cranial nerve deficit.     Sensory: No sensory deficit.     Motor: No weakness.     Coordination: Coordination is intact. Romberg sign negative. Coordination normal. Finger-Nose-Finger Test normal.     Gait: Gait is intact. Gait normal.     Deep Tendon Reflexes: Reflexes are normal and symmetric. Reflexes normal.   Psychiatric:        Attention and Perception: Attention normal.        Mood and Affect: Mood normal.        Speech: Speech normal.        Behavior: Behavior normal. Behavior is cooperative.        Thought Content: Thought content normal.        Cognition and Memory: Cognition normal.        Judgment: Judgment normal.      No results found for any visits on 12/24/23.      Connie Emperor, MD

## 2023-12-25 ENCOUNTER — Other Ambulatory Visit: Payer: Self-pay

## 2023-12-25 ENCOUNTER — Ambulatory Visit: Payer: Self-pay | Admitting: Family Medicine

## 2023-12-25 DIAGNOSIS — N912 Amenorrhea, unspecified: Secondary | ICD-10-CM

## 2023-12-25 LAB — LIPID PANEL
Cholesterol: 180 mg/dL (ref <200)
HDL: 57 mg/dL (ref >=50)
LDL Cholesterol (Calc): 105 mg/dL — ABNORMAL HIGH
Non-HDL Cholesterol (Calc): 123 mg/dL (ref <130)
Total CHOL/HDL Ratio: 3.2 (calc) (ref <5.0)
Triglycerides: 87 mg/dL (ref <150)

## 2023-12-25 LAB — CBC
HCT: 35.6 % (ref 35.0–45.0)
Hemoglobin: 11.8 g/dL (ref 11.7–15.5)
MCH: 31 pg (ref 27.0–33.0)
MCHC: 33.1 g/dL (ref 32.0–36.0)
MCV: 93.4 fL (ref 80.0–100.0)
MPV: 10.2 fL (ref 7.5–12.5)
Platelets: 219 Thousand/uL (ref 140–400)
RBC: 3.81 Million/uL (ref 3.80–5.10)
RDW: 11.7 % (ref 11.0–15.0)
WBC: 5.4 Thousand/uL (ref 3.8–10.8)

## 2023-12-25 LAB — COMPREHENSIVE METABOLIC PANEL WITH GFR
AG Ratio: 1.7 (calc) (ref 1.0–2.5)
ALT: 11 U/L (ref 6–29)
AST: 15 U/L (ref 10–30)
Albumin: 4.3 g/dL (ref 3.6–5.1)
Alkaline phosphatase (APISO): 50 U/L (ref 31–125)
BUN/Creatinine Ratio: 10 (calc) (ref 6–22)
BUN: 12 mg/dL (ref 7–25)
CO2: 27 mmol/L (ref 20–32)
Calcium: 9.3 mg/dL (ref 8.6–10.2)
Chloride: 104 mmol/L (ref 98–110)
Creat: 1.18 mg/dL — ABNORMAL HIGH (ref 0.50–0.99)
Globulin: 2.6 g/dL (ref 1.9–3.7)
Glucose, Bld: 92 mg/dL (ref 65–99)
Potassium: 4.5 mmol/L (ref 3.5–5.3)
Sodium: 138 mmol/L (ref 135–146)
Total Bilirubin: 0.3 mg/dL (ref 0.2–1.2)
Total Protein: 6.9 g/dL (ref 6.1–8.1)
eGFR: 60 mL/min/1.73m2 (ref >=60)

## 2023-12-25 LAB — FSH/LH
FSH: 2.8 m[IU]/mL
LH: 1.3 m[IU]/mL

## 2023-12-25 LAB — ESTRADIOL: Estradiol: 37 pg/mL

## 2023-12-25 LAB — TSH: TSH: 2.39 m[IU]/L

## 2024-01-14 NOTE — Progress Notes (Unsigned)
 Aletha Bene, MD   No chief complaint on file.   HPI:      Ms. Caitlyn [name] is a 43 y.o. [id number] whose LMP was No LMP recorded. (Menstrual status: Irregular Periods)., presents today for NP eval amenorrhea; premenopausal FSH/LH. Hx of PCOS with montyl menses when lost wt; has gained wt since last yr. 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   Vitamin D  deficiency 04/18/2023   Abnormal kidney function 04/18/2023   Mass of scalp 04/18/2023   History of nonmelanoma skin cancer 12/22/2021   S/P cesarean section 02/09/2017   Fatigue 06/05/2016   Encounter for immunization 02/28/2016   Midline thoracic back pain 01/11/2015   Encounter for pre-employment health screening examination 10/16/2014   Depression 12/18/2013   Insomnia 12/18/2013   Preventative health care 12/18/2013   Supervision of normal first pregnancy 08/20/2013    Past Surgical History:  Procedure Laterality Date   CESAREAN SECTION     CESAREAN SECTION N/A 02/09/2017   Procedure: CESAREAN SECTION;  Surgeon: Dannielle Bouchard, DO;  [facility]: [facility];  Service: Obstetrics;  Laterality: N/A;  Repeat edc 03/31/17 NKDA need RNFA   DILATION AND CURETTAGE OF UTERUS     EXCISION MASS HEAD Left 07/06/2023   Procedure: EXCISION LEFT FOREHEAD MASS;  Surgeon: Waddell Leonce NOVAK, MD;  Location: [facility];  Service: Plastics;  Laterality: Left;  Excision forehead mass, left side   WISDOM TOOTH EXTRACTION      Family History  Problem Relation Age of Onset   Premature ovarian failure Mother    Gout Father    Diabetes Brother    Intellectual disability Brother    Hypertension Brother    Diabetes Maternal Grandfather    Breast cancer Neg Hx    Uterine cancer Neg Hx    Colon cancer Neg Hx    Ovarian  cancer Neg Hx     Social History   Socioeconomic History   Marital status: Married    Spouse name: Not on file   Number of children: Not on file   Years of education: Not on file   Highest education level: Master's degree (e.g., MA, MS, MEng, MEd, MSW, MBA)  Occupational History   Not on file  Tobacco Use   Smoking status: Never   Smokeless tobacco: Never  Vaping Use   Vaping status: Never Used  Substance and Sexual Activity   Alcohol use: No   Drug use: No   Sexual activity: Yes    Birth control/protection: Condom  Other Topics Concern   Not on file  Social History Narrative   Married, has 2 kids (both were premies). Patient is eating healthy but unable to exercise consistently.    Social Drivers of Corporate Investment Banker Strain: Low Risk  (12/20/2023)   Overall Financial Resource Strain (CARDIA)    Difficulty of Paying Living Expenses: Not hard at all  Food Insecurity: No Food Insecurity (12/20/2023)   Hunger Vital Sign    Worried About Running Out of Food in the Last Year: Never true    Ran Out of Food in the Last Year: Never true  Transportation Needs: No Transportation Needs (12/20/2023)   PRAPARE - Administrator, Civil Service (Medical): No    Lack of Transportation (Non-Medical):  No  Physical Activity: Inactive (12/20/2023)   Exercise Vital Sign    Days of Exercise per Week: 0 days    Minutes of Exercise per Session: Not on file  Stress: Stress Concern Present (12/20/2023)   [employer] of Occupational Health - Occupational Stress Questionnaire    Feeling of Stress: Rather much  Social Connections: Moderately Integrated (12/20/2023)   Social Connection and Isolation Panel    Frequency of Communication with Friends and Family: Twice a week    Frequency of Social Gatherings with Friends and Family: Once a week    Attends Religious Services: 1 to 4 times per year    Active Member of [employer] or Organizations: No    Attends Hospital Doctor: Not on file    Marital Status: Married  Catering Manager Violence: Not on file    Outpatient Medications Prior to Visit  Medication Sig Dispense Refill   Adapalene-Benzoyl Peroxide 0.1-2.5 % gel Apply topically daily.     buPROPion  (WELLBUTRIN  XL) 150 MG 24 hr tablet Take 450 mg by mouth daily.     hydrOXYzine (ATARAX) 10 MG tablet Take by mouth.     spironolactone (ALDACTONE) 50 MG tablet Take 50 mg by mouth 2 (two) times daily.     traZODone  (DESYREL ) 100 MG tablet Take 100 mg by mouth at bedtime.     No facility-administered medications prior to visit.      ROS:  Review of Systems BREAST: No symptoms   OBJECTIVE:   Vitals:  There were no vitals taken for this visit.  Physical Exam  Results: No results found for this or any previous visit (from the past 24 hours).   Assessment/Plan: No diagnosis found.    No orders of the defined types were placed in this encounter.     No follow-ups on file.  Shanan Mcmiller B. Bodee Lafoe, PA-C 01/14/2024 3:10 PM

## 2024-01-15 ENCOUNTER — Encounter: Payer: Self-pay | Admitting: Obstetrics and Gynecology

## 2024-01-15 ENCOUNTER — Other Ambulatory Visit (HOSPITAL_COMMUNITY)
Admission: RE | Admit: 2024-01-15 | Discharge: 2024-01-15 | Disposition: A | Discharge status: Home / Self Care | Source: Ambulatory Visit | Attending: Obstetrics and Gynecology | Admitting: Obstetrics and Gynecology

## 2024-01-15 ENCOUNTER — Ambulatory Visit: Admitting: Obstetrics and Gynecology

## 2024-01-15 VITALS — BP 98/65 | HR 73 | Ht 65.0 in | Wt 158.0 lb

## 2024-01-15 DIAGNOSIS — N911 Secondary amenorrhea: Secondary | ICD-10-CM

## 2024-01-15 DIAGNOSIS — Z124 Encounter for screening for malignant neoplasm of cervix: Secondary | ICD-10-CM | POA: Diagnosis present

## 2024-01-15 DIAGNOSIS — Z3202 Encounter for pregnancy test, result negative: Secondary | ICD-10-CM

## 2024-01-15 DIAGNOSIS — Z1151 Encounter for screening for human papillomavirus (HPV): Secondary | ICD-10-CM | POA: Insufficient documentation

## 2024-01-15 DIAGNOSIS — E282 Polycystic ovarian syndrome: Secondary | ICD-10-CM

## 2024-01-15 LAB — POCT URINE PREGNANCY: Preg Test, Ur: NEGATIVE

## 2024-01-15 MED ORDER — MEDROXYPROGESTERONE ACETATE 10 MG PO TABS: 10.0000 mg | ORAL_TABLET | Freq: Every day | ORAL | 0 refills | Status: DC

## 2024-01-15 NOTE — Patient Instructions (Signed)
 I value your feedback and you entrusting [place] with your care. If you get a [facility] [place] [facility] patient survey, I would appreciate you taking the time to let us know about your experience today. Thank you! ? ? ?

## 2024-01-17 LAB — CYTOLOGY - PAP
Adequacy: ABSENT
Comment: NEGATIVE
Diagnosis: NEGATIVE
High risk HPV: NEGATIVE

## 2024-01-30 ENCOUNTER — Other Ambulatory Visit: Payer: Self-pay | Admitting: Family Medicine

## 2024-01-30 DIAGNOSIS — Z1231 Encounter for screening mammogram for malignant neoplasm of breast: Secondary | ICD-10-CM

## 2024-02-04 ENCOUNTER — Encounter: Payer: Self-pay | Admitting: Obstetrics and Gynecology

## 2024-02-04 DIAGNOSIS — N911 Secondary amenorrhea: Secondary | ICD-10-CM

## 2024-02-08 ENCOUNTER — Encounter: Payer: Self-pay | Admitting: Family Medicine

## 2024-02-10 MED ORDER — MEDROXYPROGESTERONE ACETATE 10 MG PO TABS: 10.0000 mg | ORAL_TABLET | Freq: Every day | ORAL | 0 refills | Status: AC

## 2024-02-27 ENCOUNTER — Inpatient Hospital Stay: Admission: RE | Admit: 2024-02-27 | Discharge: 2024-02-27 | Attending: Family Medicine | Admitting: Family Medicine

## 2024-02-27 DIAGNOSIS — Z1231 Encounter for screening mammogram for malignant neoplasm of breast: Secondary | ICD-10-CM

## 2024-03-03 ENCOUNTER — Telehealth: Payer: Self-pay

## 2024-03-03 ENCOUNTER — Other Ambulatory Visit: Payer: Self-pay

## 2024-03-03 DIAGNOSIS — R6889 Other general symptoms and signs: Secondary | ICD-10-CM

## 2024-03-03 MED ORDER — OSELTAMIVIR PHOSPHATE 75 MG PO CAPS: 75.0000 mg | ORAL_CAPSULE | Freq: Two times a day (BID) | ORAL | 0 refills | Status: AC

## 2024-03-03 NOTE — Telephone Encounter (Signed)
 Wife of Caitlyn [name], both who are having flu sx and the kids are sick. Okay to send in Tamiflu ? Thank you!

## 2024-12-24 ENCOUNTER — Encounter: Admitting: Family Medicine
